# Patient Record
Sex: Male | Born: 1980 | Race: Black or African American | Hispanic: No | Marital: Single | Smoking: Current every day smoker
Health system: Southern US, Community
[De-identification: ages and names within clinical notes are randomized; demographics above are authoritative.]

## PROBLEM LIST (undated history)

## (undated) ENCOUNTER — Ambulatory Visit

## (undated) DIAGNOSIS — E785 Hyperlipidemia, unspecified: Secondary | ICD-10-CM

## (undated) DIAGNOSIS — I2510 Atherosclerotic heart disease of native coronary artery without angina pectoris: Secondary | ICD-10-CM

## (undated) HISTORY — DX: Hyperlipidemia, unspecified: E78.5

---

## 2015-08-20 ENCOUNTER — Ambulatory Visit (HOSPITAL_COMMUNITY)
Admission: EM | Admit: 2015-08-20 | Discharge: 2015-08-20 | Disposition: A | Discharge status: Home / Self Care | Payer: Self-pay | Attending: Emergency Medicine | Admitting: Emergency Medicine

## 2015-08-20 ENCOUNTER — Emergency Department (HOSPITAL_COMMUNITY): Payer: Self-pay

## 2015-08-20 ENCOUNTER — Emergency Department (HOSPITAL_COMMUNITY): Payer: Self-pay | Admitting: Certified Registered Nurse Anesthetist

## 2015-08-20 ENCOUNTER — Encounter (HOSPITAL_COMMUNITY)
Admission: EM | Disposition: A | Discharge status: Home / Self Care | Payer: Self-pay | Source: Home / Self Care | Attending: Emergency Medicine

## 2015-08-20 ENCOUNTER — Encounter (HOSPITAL_COMMUNITY): Payer: Self-pay | Admitting: Emergency Medicine

## 2015-08-20 DIAGNOSIS — S99929A Unspecified injury of unspecified foot, initial encounter: Secondary | ICD-10-CM

## 2015-08-20 DIAGNOSIS — S92532B Displaced fracture of distal phalanx of left lesser toe(s), initial encounter for open fracture: Secondary | ICD-10-CM | POA: Insufficient documentation

## 2015-08-20 DIAGNOSIS — F17290 Nicotine dependence, other tobacco product, uncomplicated: Secondary | ICD-10-CM | POA: Insufficient documentation

## 2015-08-20 DIAGNOSIS — S92912B Unspecified fracture of left toe(s), initial encounter for open fracture: Secondary | ICD-10-CM

## 2015-08-20 DIAGNOSIS — Z23 Encounter for immunization: Secondary | ICD-10-CM | POA: Insufficient documentation

## 2015-08-20 DIAGNOSIS — IMO0002 Reserved for concepts with insufficient information to code with codable children: Secondary | ICD-10-CM

## 2015-08-20 DIAGNOSIS — W28XXXA Contact with powered lawn mower, initial encounter: Secondary | ICD-10-CM | POA: Insufficient documentation

## 2015-08-20 DIAGNOSIS — W010XXA Fall on same level from slipping, tripping and stumbling without subsequent striking against object, initial encounter: Secondary | ICD-10-CM | POA: Insufficient documentation

## 2015-08-20 DIAGNOSIS — M79675 Pain in left toe(s): Secondary | ICD-10-CM

## 2015-08-20 DIAGNOSIS — Y93H2 Activity, gardening and landscaping: Secondary | ICD-10-CM | POA: Insufficient documentation

## 2015-08-20 DIAGNOSIS — Y92007 Garden or yard of unspecified non-institutional (private) residence as the place of occurrence of the external cause: Secondary | ICD-10-CM | POA: Insufficient documentation

## 2015-08-20 DIAGNOSIS — S92422B Displaced fracture of distal phalanx of left great toe, initial encounter for open fracture: Secondary | ICD-10-CM | POA: Insufficient documentation

## 2015-08-20 DIAGNOSIS — Y998 Other external cause status: Secondary | ICD-10-CM | POA: Insufficient documentation

## 2015-08-20 HISTORY — PX: I & D EXTREMITY: SHX5045

## 2015-08-20 SURGERY — IRRIGATION AND DEBRIDEMENT EXTREMITY
Anesthesia: General | Site: Foot | Laterality: Left

## 2015-08-20 MED ORDER — MIDAZOLAM HCL 5 MG/5ML IJ SOLN
INTRAMUSCULAR | Status: DC | PRN
  Administered 2015-08-20: 2 mg via INTRAVENOUS

## 2015-08-20 MED ORDER — ONDANSETRON HCL 4 MG/2ML IJ SOLN
INTRAMUSCULAR | Status: DC | PRN
  Administered 2015-08-20: 4 mg via INTRAVENOUS

## 2015-08-20 MED ORDER — CEPHALEXIN 500 MG PO CAPS: 500.0000 mg | ORAL_CAPSULE | Freq: Four times a day (QID) | ORAL | Status: DC

## 2015-08-20 MED ORDER — FENTANYL CITRATE (PF) 250 MCG/5ML IJ SOLN
INTRAMUSCULAR | Status: AC
  Filled 2015-08-20: qty 5

## 2015-08-20 MED ORDER — CEFAZOLIN SODIUM-DEXTROSE 2-4 GM/100ML-% IV SOLN
2.0000 g | INTRAVENOUS | Status: AC
  Administered 2015-08-20: 2 g via INTRAVENOUS

## 2015-08-20 MED ORDER — LIDOCAINE HCL (CARDIAC) 20 MG/ML IV SOLN
INTRAVENOUS | Status: AC
  Filled 2015-08-20: qty 5

## 2015-08-20 MED ORDER — CEFTRIAXONE SODIUM 1 G IJ SOLR
1.0000 g | Freq: Once | INTRAMUSCULAR | Status: AC
  Administered 2015-08-20: 1 g via INTRAVENOUS
  Filled 2015-08-20: qty 10

## 2015-08-20 MED ORDER — HYDROMORPHONE HCL 1 MG/ML IJ SOLN: 0.2500 mg | INTRAMUSCULAR | Status: DC | PRN

## 2015-08-20 MED ORDER — POVIDONE-IODINE 10 % EX SWAB: 2.0000 "application " | Freq: Once | CUTANEOUS | Status: DC

## 2015-08-20 MED ORDER — FENTANYL CITRATE (PF) 100 MCG/2ML IJ SOLN
INTRAMUSCULAR | Status: DC | PRN
  Administered 2015-08-20: 100 ug via INTRAVENOUS

## 2015-08-20 MED ORDER — ONDANSETRON HCL 4 MG/2ML IJ SOLN: 4.0000 mg | Freq: Four times a day (QID) | INTRAMUSCULAR | Status: DC | PRN

## 2015-08-20 MED ORDER — DEXAMETHASONE SODIUM PHOSPHATE 10 MG/ML IJ SOLN
INTRAMUSCULAR | Status: AC
  Filled 2015-08-20: qty 1

## 2015-08-20 MED ORDER — ACETAMINOPHEN 325 MG PO TABS: 650.0000 mg | ORAL_TABLET | Freq: Four times a day (QID) | ORAL | Status: DC | PRN

## 2015-08-20 MED ORDER — SODIUM CHLORIDE 0.9 % IR SOLN
Status: DC | PRN
  Administered 2015-08-20: 3000 mL

## 2015-08-20 MED ORDER — OXYCODONE HCL 5 MG PO TABS: 5.0000 mg | ORAL_TABLET | Freq: Four times a day (QID) | ORAL | Status: DC | PRN

## 2015-08-20 MED ORDER — ONDANSETRON HCL 4 MG/2ML IJ SOLN
INTRAMUSCULAR | Status: AC
Start: 2015-08-20 — End: 2015-08-20
  Filled 2015-08-20: qty 2

## 2015-08-20 MED ORDER — PHENYLEPHRINE 40 MCG/ML (10ML) SYRINGE FOR IV PUSH (FOR BLOOD PRESSURE SUPPORT)
PREFILLED_SYRINGE | INTRAVENOUS | Status: AC
  Filled 2015-08-20: qty 10

## 2015-08-20 MED ORDER — GENTAMICIN SULFATE 40 MG/ML IJ SOLN
1.5000 mg/kg | Freq: Once | INTRAVENOUS | Status: AC
  Administered 2015-08-20: 130 mg via INTRAVENOUS
  Filled 2015-08-20: qty 3.25

## 2015-08-20 MED ORDER — SUCCINYLCHOLINE CHLORIDE 20 MG/ML IJ SOLN
INTRAMUSCULAR | Status: AC
  Filled 2015-08-20: qty 1

## 2015-08-20 MED ORDER — LACTATED RINGERS IV SOLN
INTRAVENOUS | Status: DC | PRN
  Administered 2015-08-20: 14:00:00 via INTRAVENOUS

## 2015-08-20 MED ORDER — OXYCODONE HCL 5 MG PO TABS: 5.0000 mg | ORAL_TABLET | Freq: Once | ORAL | Status: DC | PRN

## 2015-08-20 MED ORDER — BUPIVACAINE-EPINEPHRINE (PF) 0.5% -1:200000 IJ SOLN
INTRAMUSCULAR | Status: AC
  Filled 2015-08-20: qty 30

## 2015-08-20 MED ORDER — LIDOCAINE HCL (PF) 1 % IJ SOLN
30.0000 mL | Freq: Once | INTRAMUSCULAR | Status: AC
  Administered 2015-08-20: 30 mL via INTRADERMAL
  Filled 2015-08-20: qty 30

## 2015-08-20 MED ORDER — LIDOCAINE HCL (CARDIAC) 20 MG/ML IV SOLN
INTRAVENOUS | Status: DC | PRN
  Administered 2015-08-20: 70 mg via INTRAVENOUS

## 2015-08-20 MED ORDER — NAPROXEN SODIUM 220 MG PO TABS: 440.0000 mg | ORAL_TABLET | Freq: Two times a day (BID) | ORAL | Status: DC

## 2015-08-20 MED ORDER — BUPIVACAINE-EPINEPHRINE (PF) 0.5% -1:200000 IJ SOLN
INTRAMUSCULAR | Status: DC | PRN
  Administered 2015-08-20: 20 mL via PERINEURAL

## 2015-08-20 MED ORDER — HYDROMORPHONE HCL 1 MG/ML IJ SOLN
1.0000 mg | Freq: Once | INTRAMUSCULAR | Status: AC
  Administered 2015-08-20: 1 mg via INTRAVENOUS
  Filled 2015-08-20: qty 1

## 2015-08-20 MED ORDER — DEXAMETHASONE SODIUM PHOSPHATE 10 MG/ML IJ SOLN
INTRAMUSCULAR | Status: DC | PRN
  Administered 2015-08-20: 10 mg via INTRAVENOUS

## 2015-08-20 MED ORDER — SODIUM CHLORIDE 0.9 % IV SOLN: INTRAVENOUS | Status: DC

## 2015-08-20 MED ORDER — PROPOFOL 10 MG/ML IV BOLUS
INTRAVENOUS | Status: DC | PRN
  Administered 2015-08-20: 200 mg via INTRAVENOUS

## 2015-08-20 MED ORDER — OXYCODONE HCL 5 MG/5ML PO SOLN: 5.0000 mg | Freq: Once | ORAL | Status: DC | PRN

## 2015-08-20 MED ORDER — TETANUS-DIPHTH-ACELL PERTUSSIS 5-2.5-18.5 LF-MCG/0.5 IM SUSP
0.5000 mL | Freq: Once | INTRAMUSCULAR | Status: AC
  Administered 2015-08-20: 0.5 mL via INTRAMUSCULAR
  Filled 2015-08-20: qty 0.5

## 2015-08-20 MED ORDER — MIDAZOLAM HCL 2 MG/2ML IJ SOLN
INTRAMUSCULAR | Status: AC
  Filled 2015-08-20: qty 2

## 2015-08-20 SURGICAL SUPPLY — 60 items
BANDAGE ACE 4X5 VEL STRL LF (GAUZE/BANDAGES/DRESSINGS) ×3 IMPLANT
BANDAGE ELASTIC 4 VELCRO ST LF (GAUZE/BANDAGES/DRESSINGS) ×3 IMPLANT
BANDAGE ESMARK 6X9 LF (GAUZE/BANDAGES/DRESSINGS) ×1 IMPLANT
BLADE SURG 10 STRL SS (BLADE) ×3 IMPLANT
BNDG COHESIVE 4X5 TAN STRL (GAUZE/BANDAGES/DRESSINGS) ×3 IMPLANT
BNDG COHESIVE 6X5 TAN STRL LF (GAUZE/BANDAGES/DRESSINGS) ×3 IMPLANT
BNDG CONFORM 3 STRL LF (GAUZE/BANDAGES/DRESSINGS) ×3 IMPLANT
BNDG ESMARK 6X9 LF (GAUZE/BANDAGES/DRESSINGS) ×3
CANISTER SUCT 3000ML PPV (MISCELLANEOUS) ×3 IMPLANT
CHLORAPREP W/TINT 26ML (MISCELLANEOUS) IMPLANT
COVER SURGICAL LIGHT HANDLE (MISCELLANEOUS) ×3 IMPLANT
CUFF TOURNIQUET SINGLE 34IN LL (TOURNIQUET CUFF) IMPLANT
CUFF TOURNIQUET SINGLE 44IN (TOURNIQUET CUFF) IMPLANT
DRAIN CHANNEL 10M FLAT 3/4 FLT (DRAIN) IMPLANT
DRAIN PENROSE 1/2X12 LTX STRL (WOUND CARE) IMPLANT
DRAPE U-SHAPE 47X51 STRL (DRAPES) ×3 IMPLANT
DRSG ADAPTIC 3X8 NADH LF (GAUZE/BANDAGES/DRESSINGS) ×3 IMPLANT
DRSG MEPITEL 3X4 ME34 (GAUZE/BANDAGES/DRESSINGS) ×3 IMPLANT
DRSG PAD ABDOMINAL 8X10 ST (GAUZE/BANDAGES/DRESSINGS) IMPLANT
ELECT REM PT RETURN 9FT ADLT (ELECTROSURGICAL) ×3
ELECTRODE REM PT RTRN 9FT ADLT (ELECTROSURGICAL) ×1 IMPLANT
EVACUATOR SILICONE 100CC (DRAIN) IMPLANT
GAUZE SPONGE 4X4 12PLY STRL (GAUZE/BANDAGES/DRESSINGS) ×3 IMPLANT
GLOVE BIO SURGEON STRL SZ8 (GLOVE) ×3 IMPLANT
GLOVE BIOGEL PI IND STRL 8 (GLOVE) ×2 IMPLANT
GLOVE BIOGEL PI INDICATOR 8 (GLOVE) ×4
GLOVE ECLIPSE 7.5 STRL STRAW (GLOVE) ×3 IMPLANT
GOWN STRL REUS W/ TWL XL LVL3 (GOWN DISPOSABLE) ×2 IMPLANT
GOWN STRL REUS W/TWL XL LVL3 (GOWN DISPOSABLE) ×4
KIT BASIN OR (CUSTOM PROCEDURE TRAY) ×3 IMPLANT
KIT ROOM TURNOVER OR (KITS) ×3 IMPLANT
NEEDLE 22X1 1/2 (OR ONLY) (NEEDLE) ×3 IMPLANT
NS IRRIG 1000ML POUR BTL (IV SOLUTION) ×3 IMPLANT
PACK ORTHO EXTREMITY (CUSTOM PROCEDURE TRAY) ×3 IMPLANT
PAD ARMBOARD 7.5X6 YLW CONV (MISCELLANEOUS) ×6 IMPLANT
PAD CAST 4YDX4 CTTN HI CHSV (CAST SUPPLIES) ×1 IMPLANT
PADDING CAST ABS 4INX4YD NS (CAST SUPPLIES) ×2
PADDING CAST ABS COTTON 4X4 ST (CAST SUPPLIES) ×1 IMPLANT
PADDING CAST COTTON 4X4 STRL (CAST SUPPLIES) ×2
SET CYSTO W/LG BORE CLAMP LF (SET/KITS/TRAYS/PACK) ×3 IMPLANT
SOAP 2 % CHG 4 OZ (WOUND CARE) ×3 IMPLANT
SPONGE LAP 4X18 X RAY DECT (DISPOSABLE) ×3 IMPLANT
STAPLER VISISTAT 35W (STAPLE) IMPLANT
SUCTION FRAZIER HANDLE 10FR (MISCELLANEOUS) ×2
SUCTION TUBE FRAZIER 10FR DISP (MISCELLANEOUS) ×1 IMPLANT
SUT ETHILON 3 0 FSL (SUTURE) IMPLANT
SUT ETHILON 4 0 PS 2 18 (SUTURE) ×3 IMPLANT
SUT MNCRL AB 3-0 PS2 18 (SUTURE) ×3 IMPLANT
SUT PROLENE 3 0 PS 2 (SUTURE) IMPLANT
SUT VIC AB 2-0 CT1 27 (SUTURE)
SUT VIC AB 2-0 CT1 TAPERPNT 27 (SUTURE) IMPLANT
SUT VIC AB 3-0 PS2 18 (SUTURE)
SUT VIC AB 3-0 PS2 18XBRD (SUTURE) IMPLANT
TOWEL OR 17X24 6PK STRL BLUE (TOWEL DISPOSABLE) ×3 IMPLANT
TOWEL OR 17X26 10 PK STRL BLUE (TOWEL DISPOSABLE) ×3 IMPLANT
TUBE CONNECTING 12'X1/4 (SUCTIONS) ×1
TUBE CONNECTING 12X1/4 (SUCTIONS) ×2 IMPLANT
TUBING CYSTO DISP (UROLOGICAL SUPPLIES) ×3 IMPLANT
WATER STERILE IRR 1000ML POUR (IV SOLUTION) IMPLANT
YANKAUER SUCT BULB TIP NO VENT (SUCTIONS) ×3 IMPLANT

## 2015-08-20 NOTE — Transfer of Care (Signed)
Immediate Anesthesia Transfer of Care Note  Patient: Edward Schroeder  Procedure(s) Performed: Procedure(s): IRRIGATION AND DEBRIDEMENT OF FIRST SECOND AND THIRD TOE FRACTURE (Left)  Patient Location: PACU  Anesthesia Type:General  Level of Consciousness: awake, alert  and oriented  Airway & Oxygen Therapy: Patient Spontanous Breathing  Post-op Assessment: Report given to RN and Post -op Vital signs reviewed and stable  Post vital signs: Reviewed and stable  Last Vitals:  Filed Vitals:   08/20/15 1200 08/20/15 1230  BP: 117/78 128/87  Pulse:  50  Temp:    Resp:      Complications: No apparent anesthesia complications

## 2015-08-20 NOTE — ED Notes (Signed)
Sterile saline poured over pt foot and placed in a bath.

## 2015-08-20 NOTE — ED Notes (Signed)
MD at bedside. 

## 2015-08-20 NOTE — Anesthesia Preprocedure Evaluation (Addendum)
Anesthesia Evaluation  Patient identified by MRN, date of birth, ID band Patient awake    Reviewed: Allergy & Precautions, NPO status , Patient's Chart, lab work & pertinent test results  Airway Mallampati: II   Neck ROM: full    Dental   Pulmonary Current Smoker,    breath sounds clear to auscultation       Cardiovascular Exercise Tolerance: Good negative cardio ROS   Rhythm:regular Rate:Normal     Neuro/Psych negative neurological ROS  negative psych ROS   GI/Hepatic negative GI ROS, Neg liver ROS,   Endo/Other  negative endocrine ROS  Renal/GU negative Renal ROS     Musculoskeletal   Abdominal   Peds  Hematology negative hematology ROS (+)   Anesthesia Other Findings   Reproductive/Obstetrics                            BP Readings from Last 3 Encounters:  08/20/15 128/87   No results found for: WBC, HGB, HCT, MCV, PLT   Chemistry   No results found for: NA, K, CL, CO2, BUN, CREATININE, GLU No results found for: CALCIUM, ALKPHOS, AST, ALT, BILITOT    Anesthesia Physical Anesthesia Plan  ASA: II  Anesthesia Plan: General   Post-op Pain Management:    Induction: Intravenous  Airway Management Planned: LMA  Additional Equipment:   Intra-op Plan:   Post-operative Plan:   Informed Consent: I have reviewed the patients History and Physical, chart, labs and discussed the procedure including the risks, benefits and alternatives for the proposed anesthesia with the patient or authorized representative who has indicated his/her understanding and acceptance.     Plan Discussed with: CRNA, Surgeon and Anesthesiologist  Anesthesia Plan Comments:         Anesthesia Quick Evaluation

## 2015-08-20 NOTE — ED Notes (Signed)
Pt arrives via pov with c/o getting his left foot stuck under a lawnmower, bleeding controlled at this time, all toes appear to be intact.

## 2015-08-20 NOTE — H&P (Signed)
Edward Schroeder is an 35 y.o. male.   Chief Complaint: left forefoot injury HPI:  35 y/o male without significant PMH presents to the ER this morning after injuring his foot while mowing the grass.  He was pushing a lawnmower down a hill when he slipped on wet grass and his left foot went under the mower deck.  He had shoes on.  No h/o significant injury or surgery to the left foot or ankle.  He smokes black and milds.  No recent f/c/n/v/wt loss.  He c/o aching pain in the toes that is worse with ROM and better with pain meds.  He denies numbness in the toes.  He last ate at 0300 this morning.  He's had a dose of rocephin this morning in the ER.  History reviewed. No pertinent past medical history.  No PMH.  History reviewed. No pertinent past surgical history.  No PSH  No family history on file.  FH neg for diabetes and HTN.  Social History:  reports that he has been smoking Cigars.  He does not have any smokeless tobacco history on file. He reports that he does not drink alcohol or use illicit drugs.  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  Allergies: No Known Allergies   (Not in a hospital admission)  No results found for this or any previous visit (from the past 48 hour(s)). Dg Foot Complete Left  08/20/2015  CLINICAL DATA:  Acute left foot pain after lawnmower injury. Initial encounter. EXAM: LEFT FOOT - COMPLETE 3+ VIEW COMPARISON:  None. FINDINGS: Moderately displaced fractures are seen involving the second and third distal phalanges. Minimally displaced fracture is seen involving the proximal base of the first distal phalanx. These appear to be closed and posttraumatic. Joint spaces are intact. No radiopaque foreign body is noted. IMPRESSION: Fractures are seen involving the first, second and distal phalanges. Electronically Signed   By: Lupita RaiderJames  Schroeder Jr, M.D.   On: 08/20/2015 09:46    ROS  No recent f/c/n/v/wt loss.  Blood pressure 122/90, pulse 70, temperature 98.2  F (36.8 C), temperature source Oral, resp. rate 12, SpO2 96 %. Physical Exam  wn wd male in nad. A and O x 4.  Mood and affect normal.  EOMI.  resp unlabored.  L foot with lacerations at the tips of the hallux, 2nd and 3rd toes.  Nail bed involvement of hallux and 2nd toe.  Cap refill intact at the tips of the toes, but the flap at the tip of the 2nd toe is slightly dusky.  Gross contamination of the wounds with dirt and grass.  Sens to LT intact at the toes.  No lymphadenopathy.  5/5 strength in PF and DF of the ankle.  Assessment/Plan L hallux, 2nd and 3rd toe open fractures and laceration - to OR for I and D of open fracture.  Gent in addition to cephalosporin given the level of contamination.  The risks and benefits of the alternative treatment options have been discussed in detail.  The patient wishes to proceed with surgery and specifically understands risks of bleeding, infection, nerve damage, blood clots, need for additional surgery, amputation and death.  I've specifically expressed my concern regarding the tip of the 2nd toe.  I explained that it may not survive and may ultimately require amputation.  Edward Schroeder, Edward Nienhuis, MD 08/20/2015, 10:48 AM

## 2015-08-20 NOTE — Anesthesia Procedure Notes (Signed)
Procedure Name: LMA Insertion Date/Time: 08/20/2015 2:01 PM Performed by: Fabian NovemberSOLHEIM, Majesty Stehlin SALOMAN Pre-anesthesia Checklist: Patient identified, Patient being monitored, Timeout performed, Emergency Drugs available and Suction available Patient Re-evaluated:Patient Re-evaluated prior to inductionOxygen Delivery Method: Circle System Utilized Preoxygenation: Pre-oxygenation with 100% oxygen Intubation Type: IV induction Ventilation: Mask ventilation without difficulty LMA: LMA inserted LMA Size: 5.0 Number of attempts: 1 Placement Confirmation: positive ETCO2 and breath sounds checked- equal and bilateral Tube secured with: Tape Dental Injury: Teeth and Oropharynx as per pre-operative assessment

## 2015-08-20 NOTE — Discharge Instructions (Signed)
Toni ArthursJohn Yashira Offenberger, MD [facility]  Please read the following information regarding your care after surgery.  Medications  You only need a prescription for the narcotic pain medicine (ex. oxycodone, Percocet, Norco).  All of the other medicines listed below are available over the counter. X acetominophen (Tylenol) 650 mg every 4-6 hours as you need for minor pain X oxycodone as prescribed for severe pain X Aleve 2 pills twice a day for 3-5 days after surgery  Weight Bearing X Bear weight when you are able on your operated leg or foot in the post-op shoe.  Cast / Splint / Dressing X Keep your dressing clean and dry.  Dont put anything (coat hanger, pencil, etc) down inside of it.  If it gets damp, use a hair dryer on the cool setting to dry it.  If it gets soaked, call the office to schedule an appointment for a cast change.   After your dressing, cast or splint is removed; you may shower, but do not soak or scrub the wound.  Allow the water to run over it, and then gently pat it dry.  Swelling It is normal for you to have swelling where you had surgery.  To reduce swelling and pain, keep your toes above your nose for at least 3 days after surgery.  It may be necessary to keep your foot or leg elevated for several weeks.  If it hurts, it should be elevated.  Follow Up Call my office at [phone] when you are discharged from the hospital or surgery center to schedule an appointment to be seen two weeks after surgery.  Call my office at [phone] if you develop a fever >101.5 F, nausea, vomiting, bleeding from the surgical site or severe pain.

## 2015-08-20 NOTE — ED Provider Notes (Signed)
CSN: [id number]     Arrival date & time 08/20/15  0831 History   First MD Initiated Contact with Patient 08/20/15 [phone]     Chief Complaint  Patient presents with  . Toe Injury     (Consider location/radiation/quality/duration/timing/severity/associated sxs/prior Treatment) HPI Edward Schroeder is a 35 y.o. male with no significant PMH who presents with toe injury sustained just PTA.  Patient reports he was mowing the lawn in tennis shoes when he slipped on a hill causing his left toes to slide underneath the mower and got caught in the blades. NKA.  No meds PTA.  His symptoms were sudden in onset, constant, and severe. Tetanus status unknown. He last ate yesterday.   History reviewed. No pertinent past medical history. History reviewed. No pertinent past surgical history. No family history on file. Social History  Substance Use Topics  . Smoking status: Current Every Day Smoker    Types: Cigars  . Smokeless tobacco: None  . Alcohol Use: No    Review of Systems All other systems negative unless otherwise stated in HPI    Allergies  Review of patient's allergies indicates no known allergies.  Home Medications   Prior to Admission medications   Not on File   BP 131/86 mmHg  Pulse 57  Temp(Src) 98.2 F (36.8 C) (Oral)  Resp 12  SpO2 97% Physical Exam  Constitutional: He is oriented to person, place, and time. He appears well-developed and well-nourished.  HENT:  Head: Normocephalic and atraumatic.  Right Ear: External ear normal.  Left Ear: External ear normal.  Eyes: Conjunctivae are normal. No scleral icterus.  Neck: No tracheal deviation present.  Cardiovascular:  Pulses:      Dorsalis pedis pulses are 2+ on the right side, and 2+ on the left side.  Pulmonary/Chest: Effort normal. No respiratory distress.  Abdominal: He exhibits no distension.  Musculoskeletal: Normal range of motion.  Neurological: He is alert and oriented to person, place, and time.  Sensation  intact distal to injury.  Patient able to move all toes.   Skin: Skin is warm and dry. Laceration noted.  Left great toe: nail has been cut, suspect nail bed laceration.  Grass and dirt in lacerations.  See photos below.   Psychiatric: He has a normal mood and affect. His behavior is normal.      ED Course  Procedures (including critical care time)  NERVE BLOCK Performed by: Cheri FowlerKayla Bradie Lacock Consent: Verbal consent obtained. Required items: required blood products, implants, devices, and special equipment available Time out: Immediately prior to procedure a "time out" was called to verify the correct patient, procedure, equipment, support staff and site/side marked as required.  Indication: toe injuries on left 1st, 2nd, and 3rd toes Nerve block body site: left 1st, 2nd, and 3rd toes  Preparation: Patient was prepped and draped in the usual sterile fashion. Needle gauge: 24 G Location technique: anatomical landmarks  Local anesthetic: lidocaine 1% without epinephrine  Anesthetic total: 8 ml  Outcome: pain improved Patient tolerance: Patient tolerated the procedure well with no immediate complications.  Labs Review Labs Reviewed - No data to display  Imaging Review Dg Foot Complete Left  08/20/2015  CLINICAL DATA:  Acute left foot pain after lawnmower injury. Initial encounter. EXAM: LEFT FOOT - COMPLETE 3+ VIEW COMPARISON:  None. FINDINGS: Moderately displaced fractures are seen involving the second and third distal phalanges. Minimally displaced fracture is seen involving the proximal base of the first distal phalanx. These appear to be closed  and posttraumatic. Joint spaces are intact. No radiopaque foreign body is noted. IMPRESSION: Fractures are seen involving the first, second and distal phalanges. Electronically Signed   By: Lupita Raider, M.D.   On: 08/20/2015 09:46   I have personally reviewed and evaluated these images and lab results as part of my medical  decision-making.   EKG Interpretation None      MDM   Final diagnoses:  Toe fracture, left, open, initial encounter  Laceration  Pain of toe of left foot   Patient presents with multiple toe injuries sustained from lawn mower blade.  VSS, NAD.  NKA.  Will perform digital blocks, manage pain, start 1g IV rocephin, up date tetanus, obtain plain films.  Plain films show fractures involving the first, second, and third distal phalanges.  Will consult ortho.  11 AM: patient will be taken to OR for I&D by orthopedics, Dr. Victorino Dike, for I&D. 1.5 mg/kg IV Gentamicin ordered. Wound gently irrigated with 1L NS and loosely dressed.   Case has been discussed with and seen by Dr. Radford Pax who agrees with the above plan.     Cheri Fowler, PA-C 08/20/15 1137  Nelva Nay, MD 08/20/15 1154

## 2015-08-20 NOTE — Op Note (Signed)
Edward Schroeder, Edward Schroeder NO.:  [id number]  MEDICAL RECORD NO.:  [id number]  LOCATION:  [facility]                         FACILITY:  [facility]  PHYSICIAN:  Toni Arthurs, MD        DATE OF BIRTH:  April 20, 1981  DATE OF PROCEDURE:  08/20/2015 DATE OF DISCHARGE:                              OPERATIVE REPORT   PREOPERATIVE DIAGNOSES: 1. Open fracture left hallux with gross contamination. 2. Open fracture left second toe with gross contamination. 3. Open fracture left third toe with gross contamination. 4. Left hallux stellate nail bed laceration. 5. Left second toe transverse laceration. 6. Left third toe nail bed laceration.  POSTOPERATIVE DIAGNOSES: 1. Open fracture left hallux distal phalanx with gross contamination. 2. Open fracture left second toe distal phalanx with gross     contamination. 3. Left hallux stellate nail bed laceration measuring 2 cm in length. 4. Left second toe transverse laceration measuring 1.5 cm in length. 5. Left third toe nail bed laceration measuring 1 cm with gross     contamination.  PROCEDURE: 1. Irrigation and debridement of left hallux open fracture including     skin, subcutaneous tissue, and bone. 2. Irrigation and debridement of left second toe open fracture     including skin, subcutaneous tissue, and bone. 3. Complex closure of left hallux nail bed laceration 2 cm in length. 4. Complex closure left second toe laceration 1.5 cm in length. 5. Complex closure left third toe nail bed laceration 1 cm in length. 6. Closed treatment of left second toe fracture with manipulation.  SURGEON:  Toni Arthurs, M.D.  ANESTHESIA:  General.  ESTIMATED BLOOD LOSS:  Minimal.  TOURNIQUET TIME:  Approximately 30 minutes with an ankle Esmarch.  COMPLICATIONS:  None apparent.  DISPOSITION:  Extubated, awake, and stable to recovery.  INDICATION FOR PROCEDURE:  The patient is a 35 year old male with past medical history significant only for smoking.   He was mowing grass early this morning when he slipped walking down a hill behind his lawn mower. He slid forward and his left foot went underneath the mower deck.  He was evaluated in the emergency department and the physical examination revealed open fractures of the hallux second and third toes on his left foot with gross contamination with dirt and grass.  He had a dose of Rocephin in the emergency department.  He also underwent provisional irrigation and dressing of the wound.  He presents now for operative treatment of these open fractures.  He understands the risks and benefits, the alternative treatment options, and elects surgical treatment.  He specifically understands risks of bleeding, infection, nerve damage, blood clots, need for additional surgery, continued pain, nonunion, amputation, and death.  PROCEDURE IN DETAIL:  After preoperative consent was obtained and the correct operative site was identified, the patient was brought to the operating room and placed supine on the operating table.  General anesthesia was induced.  Preoperative antibiotics were administered. This consisted of cefazolin and gentamicin given the gross contamination of the wound.  Surgical time-out was taken.  Left lower extremity was then prepped and draped in standard sterile fashion.  Foot was exsanguinated and an Esmarch tourniquet wrapped  around the ankle.  The left forefoot wounds were then carefully examined.  The hallux nail had been previously removed.  The hallux was noted to have a stellate laceration that was involving the entire nail bed.  The tuft of the distal phalanx was noted to be fractured.  The nail bed laceration totaled 2 cm in length.  The second toe was then carefully examined, and there was a transverse laceration between the dorsal aspect of the DIP joint and the proximal nail fold.  This fracture was noted to extend down through the distal phalanx bone.  The laceration  measured 1.5 cm in length and involved nearly 50% of the medial aspect of the toe.  The third toe was then carefully examined.  It was noted to have a transverse laceration across the nail bed with no apparent fracture though the bone was exposed at the base of the nail bed.  The nail was loose from the nail bed and the germinal matrix.  Careful debridement was then performed with scissors and a rongeur.  All devitalized tissue and gross contamination was removed from all 3 wounds.  At the hallux fracture site, the distal fragment was noted to be quite small and this was removed with a rongeur.  The tuft of the hallux was also shortened slightly with the rongeur to allow better soft tissue coverage.  The second toe fracture site was also cleaned with a rongeur.  There was no evidence of comminution, so the distal bone was left in place but carefully debrided.  The third toe nail bed laceration was debrided after the nail was removed in its entirety.  Debridement in all 3 cases was carried out in circumferential fashion from the level of the skin down through subcutaneous tissues to the level of the bone. The 3 wounds were then carefully irrigated with 3 L of normal saline. The hallux nail bed laceration was then repaired with simple sutures of 4-0 Monocryl.  Once all of the bone was covered by the nail bed, the distal laceration was closed with horizontal mattress sutures of 3-0 nylon.  The second toe laceration was then repaired with simple and horizontal mattress sutures of 3-0 nylon.  The third toe nail bed laceration was then repaired with simple sutures of 4-0 Monocryl.  A 0.5% Marcaine with epinephrine was then infiltrated into the first, second and third web spaces, and digital block performed at the hallux for postoperative pain control.  Sterile dressings were applied followed by a well-padded compression wrap.  Tourniquet was released prior to dressing the wounds approximately  30 minutes.  The hallux fracture was effectively treated by removing the loose fragment of bone distally.  The second toe fracture was appropriately reduced and was generally stable with the soft tissue closer.  I believe this can be treated safely in closed fashion after reducing the fracture site.  The patient was then awakened from anesthesia and transported to the recovery room in stable condition.  FOLLOWUP PLAN:  The patient will be weightbearing as tolerated on his left foot in a flat postop shoe.  He will follow up with me in the office on Wednesday for wound check and dressing change.  He will be discharged on Keflex 500 mg 3 times a day for a week as well as oxycodone and Aleve for pain.     Toni ArthursJohn Valoria Tamburri, MD     JH/MEDQ  D:  08/20/2015  T:  08/20/2015  Job:  [id number]

## 2015-08-20 NOTE — Anesthesia Postprocedure Evaluation (Signed)
Anesthesia Post Note  Patient: Edward Schroeder  Procedure(s) Performed: Procedure(s) (LRB): IRRIGATION AND DEBRIDEMENT OF FIRST SECOND AND THIRD TOE FRACTURE (Left)  Patient location during evaluation: PACU Anesthesia Type: General Level of consciousness: awake and alert and patient cooperative Pain management: pain level controlled Vital Signs Assessment: post-procedure vital signs reviewed and stable Respiratory status: spontaneous breathing and respiratory function stable Cardiovascular status: stable Anesthetic complications: no    Last Vitals:  Filed Vitals:   08/20/15 1500 08/20/15 1530  BP: 110/60 108/63  Pulse: 54 46  Temp:    Resp: 11 11    Last Pain:  Filed Vitals:   08/20/15 1538  PainSc: Asleep                 Akiko Schexnider S

## 2015-08-20 NOTE — Brief Op Note (Signed)
08/20/2015  2:46 PM  PATIENT:  Edward Schroeder  35 y.o. male  PRE-OPERATIVE DIAGNOSIS: 1.  Open fracture left hallux with gross contamination      2.  Open fracture left 2nd toe with gross contamination      3.  Open fracture left 3rd toe with gross contamination      4.  Left hallux stellate laceration      5.  Left 2nd toe transverse laceration      6.  Left 3rd toe nailbed laceration  POST-OPERATIVE DIAGNOSIS: 1.  Open fracture left hallux distal phalanx with gross contamination      2.  Open fracture left 2nd toe distal phalanx with gross contamination      3.  Left hallux stellate nailbed laceration (2 cm)      4.  Left 2nd toe transverse laceration (1.5 cm)      5.  Left 3rd toe nailbed laceration (1 cm) with gross contamination  Procedure(s): 1.  Irrigation and debridement of left hallux open fracture including skin, subcut tissue and bone   2.  Irrigation and debridement of left 2nd toe open fracture including skin, subcut tissue and bone   3.  Complex closure left hallux nail bed laceration (2 cm)   4.  Complex closure left 2nd toe laceration (1.5 cm)   5.  Complex closure left 3rd toe nailbed laceration (1 cm)   6.  Closed treatment left 2nd toe fracture with manipulation   SURGEON:  Toni ArthursJohn Whittaker Lenis, MD  ASSISTANT: n/a  ANESTHESIA:   General  EBL:  minimal   TOURNIQUET:  approx 30 min with ankle esmarch  COMPLICATIONS:  None apparent  DISPOSITION:  Extubated, awake and stable to recovery.  DICTATION ID:  [id number]

## 2015-08-20 NOTE — Progress Notes (Signed)
Orthopedic Tech Progress Note Patient Details:  Edward FannyCalvin Schroeder 10-Oct-1980 [id number]  Ortho Devices Type of Ortho Device: Postop shoe/boot Ortho Device/Splint Location: lle Ortho Device/Splint Interventions: Application As ordered by Dr. Ann MakiHewitt  Edward Schroeder 08/20/2015, 3:33 PM

## 2015-08-22 ENCOUNTER — Encounter (HOSPITAL_COMMUNITY): Payer: Self-pay | Admitting: Orthopedic Surgery

## 2016-06-03 ENCOUNTER — Encounter (HOSPITAL_COMMUNITY): Payer: Self-pay | Admitting: *Deleted

## 2016-06-03 ENCOUNTER — Emergency Department (HOSPITAL_COMMUNITY): Payer: Self-pay

## 2016-06-03 ENCOUNTER — Observation Stay (HOSPITAL_COMMUNITY)
Admission: EM | Admit: 2016-06-03 | Discharge: 2016-06-04 | Disposition: A | Discharge status: Home / Self Care | Payer: Self-pay | Attending: Student in an Organized Health Care Education/Training Program | Admitting: Student in an Organized Health Care Education/Training Program

## 2016-06-03 DIAGNOSIS — F17290 Nicotine dependence, other tobacco product, uncomplicated: Secondary | ICD-10-CM

## 2016-06-03 DIAGNOSIS — J111 Influenza due to unidentified influenza virus with other respiratory manifestations: Principal | ICD-10-CM

## 2016-06-03 DIAGNOSIS — R51 Headache: Secondary | ICD-10-CM

## 2016-06-03 DIAGNOSIS — M549 Dorsalgia, unspecified: Secondary | ICD-10-CM

## 2016-06-03 DIAGNOSIS — R519 Headache, unspecified: Secondary | ICD-10-CM | POA: Diagnosis present

## 2016-06-03 DIAGNOSIS — R509 Fever, unspecified: Secondary | ICD-10-CM | POA: Diagnosis present

## 2016-06-03 DIAGNOSIS — M545 Low back pain: Secondary | ICD-10-CM

## 2016-06-03 DIAGNOSIS — M5126 Other intervertebral disc displacement, lumbar region: Secondary | ICD-10-CM

## 2016-06-03 LAB — CSF CELL COUNT WITH DIFFERENTIAL
RBC COUNT CSF: 1095 /mm3 — AB
RBC COUNT CSF: 9825 /mm3 — AB
TUBE #: 1
TUBE #: 4
WBC, CSF: 2 /mm3 (ref 0–5)
WBC, CSF: 5 /mm3 (ref 0–5)

## 2016-06-03 LAB — COMPREHENSIVE METABOLIC PANEL
ALBUMIN: 4.2 g/dL (ref 3.5–5.0)
ALT: 20 U/L (ref 17–63)
ANION GAP: 9 (ref 5–15)
AST: 26 U/L (ref 15–41)
Alkaline Phosphatase: 55 U/L (ref 38–126)
BUN: 11 mg/dL (ref 6–20)
CHLORIDE: 103 mmol/L (ref 101–111)
CO2: 23 mmol/L (ref 22–32)
CREATININE: 1.15 mg/dL (ref 0.61–1.24)
Calcium: 9.4 mg/dL (ref 8.9–10.3)
GFR calc non Af Amer: >60 mL/min (ref >60)
Glucose, Bld: 124 mg/dL — ABNORMAL HIGH (ref 65–99)
Potassium: 4.1 mmol/L (ref 3.5–5.1)
SODIUM: 135 mmol/L (ref 135–145)
Total Bilirubin: 0.3 mg/dL (ref 0.3–1.2)
Total Protein: 7.1 g/dL (ref 6.5–8.1)

## 2016-06-03 LAB — URINALYSIS, ROUTINE W REFLEX MICROSCOPIC
BILIRUBIN URINE: NEGATIVE
Glucose, UA: NEGATIVE mg/dL
HGB URINE DIPSTICK: NEGATIVE
Ketones, ur: NEGATIVE mg/dL
Leukocytes, UA: NEGATIVE
Nitrite: NEGATIVE
PROTEIN: NEGATIVE mg/dL
Specific Gravity, Urine: 1.032 — ABNORMAL HIGH (ref 1.005–1.030)
pH: 7 (ref 5.0–8.0)

## 2016-06-03 LAB — CBC WITH DIFFERENTIAL/PLATELET
Basophils Absolute: 0 10*3/uL (ref 0.0–0.1)
Basophils Relative: 0 %
Eosinophils Absolute: 0 10*3/uL (ref 0.0–0.7)
Eosinophils Relative: 0 %
HEMATOCRIT: 46.6 % (ref 39.0–52.0)
HEMOGLOBIN: 15.9 g/dL (ref 13.0–17.0)
LYMPHS ABS: 1.2 10*3/uL (ref 0.7–4.0)
Lymphocytes Relative: 12 %
MCH: 31.1 pg (ref 26.0–34.0)
MCHC: 34.1 g/dL (ref 30.0–36.0)
MCV: 91.2 fL (ref 78.0–100.0)
MONOS PCT: 1 %
Monocytes Absolute: 0.1 10*3/uL (ref 0.1–1.0)
NEUTROS ABS: 8.6 10*3/uL — AB (ref 1.7–7.7)
NEUTROS PCT: 87 %
Platelets: 216 10*3/uL (ref 150–400)
RBC: 5.11 MIL/uL (ref 4.22–5.81)
RDW: 13.5 % (ref 11.5–15.5)
WBC: 9.9 10*3/uL (ref 4.0–10.5)

## 2016-06-03 LAB — LACTIC ACID, PLASMA: Lactic Acid, Venous: 0.7 mmol/L (ref 0.5–1.9)

## 2016-06-03 LAB — RAPID URINE DRUG SCREEN, HOSP PERFORMED
Amphetamines: NOT DETECTED
BARBITURATES: NOT DETECTED
BENZODIAZEPINES: NOT DETECTED
Cocaine: NOT DETECTED
Opiates: POSITIVE — AB
TETRAHYDROCANNABINOL: NOT DETECTED

## 2016-06-03 LAB — SEDIMENTATION RATE: SED RATE: 1 mm/h (ref 0–16)

## 2016-06-03 LAB — GLUCOSE, CSF: GLUCOSE CSF: 70 mg/dL (ref 40–70)

## 2016-06-03 LAB — I-STAT CG4 LACTIC ACID, ED: Lactic Acid, Venous: 2.12 mmol/L (ref 0.5–1.9)

## 2016-06-03 LAB — PROTEIN, CSF: Total  Protein, CSF: 44 mg/dL (ref 15–45)

## 2016-06-03 LAB — C-REACTIVE PROTEIN: CRP: 0.8 mg/dL (ref <1.0)

## 2016-06-03 MED ORDER — SODIUM CHLORIDE 0.9 % IV BOLUS (SEPSIS)
1000.0000 mL | Freq: Once | INTRAVENOUS | Status: AC
  Administered 2016-06-03: 1000 mL via INTRAVENOUS

## 2016-06-03 MED ORDER — HYDROMORPHONE HCL 2 MG/ML IJ SOLN
1.0000 mg | Freq: Once | INTRAMUSCULAR | Status: AC
  Administered 2016-06-03: 1 mg via INTRAVENOUS
  Filled 2016-06-03: qty 1

## 2016-06-03 MED ORDER — DEXTROSE 5 % IV SOLN
2.0000 g | Freq: Two times a day (BID) | INTRAVENOUS | Status: DC
  Filled 2016-06-03: qty 2

## 2016-06-03 MED ORDER — CYCLOBENZAPRINE HCL 10 MG PO TABS
10.0000 mg | ORAL_TABLET | Freq: Three times a day (TID) | ORAL | Status: DC | PRN
  Administered 2016-06-04: 10 mg via ORAL
  Filled 2016-06-03: qty 1

## 2016-06-03 MED ORDER — ENOXAPARIN SODIUM 40 MG/0.4ML ~~LOC~~ SOLN
40.0000 mg | SUBCUTANEOUS | Status: DC
  Administered 2016-06-03: 40 mg via SUBCUTANEOUS
  Filled 2016-06-03: qty 0.4

## 2016-06-03 MED ORDER — ACETAMINOPHEN 650 MG RE SUPP: 650.0000 mg | Freq: Four times a day (QID) | RECTAL | Status: DC | PRN

## 2016-06-03 MED ORDER — DEXTROSE 5 % IV SOLN
2.0000 g | Freq: Once | INTRAVENOUS | Status: AC
  Administered 2016-06-03: 2 g via INTRAVENOUS
  Filled 2016-06-03: qty 2

## 2016-06-03 MED ORDER — KETOROLAC TROMETHAMINE 30 MG/ML IJ SOLN
30.0000 mg | Freq: Four times a day (QID) | INTRAMUSCULAR | Status: DC | PRN
Start: 2016-06-03 — End: 2016-06-04
  Administered 2016-06-03 – 2016-06-04 (×2): 30 mg via INTRAVENOUS
  Filled 2016-06-03 (×2): qty 1

## 2016-06-03 MED ORDER — ACETAMINOPHEN 500 MG PO TABS
1000.0000 mg | ORAL_TABLET | Freq: Once | ORAL | Status: AC
  Administered 2016-06-03: 1000 mg via ORAL
  Filled 2016-06-03: qty 2

## 2016-06-03 MED ORDER — DEXTROSE 5 % IV SOLN
2.0000 g | Freq: Two times a day (BID) | INTRAVENOUS | Status: DC
  Administered 2016-06-03: 2 g via INTRAVENOUS
  Filled 2016-06-03 (×2): qty 2

## 2016-06-03 MED ORDER — LIDOCAINE-EPINEPHRINE 1 %-1:100000 IJ SOLN: 20.0000 mL | Freq: Once | INTRAMUSCULAR | Status: DC

## 2016-06-03 MED ORDER — LIDOCAINE-EPINEPHRINE 2 %-1:100000 IJ SOLN
20.0000 mL | Freq: Once | INTRAMUSCULAR | Status: DC
  Filled 2016-06-03: qty 20

## 2016-06-03 MED ORDER — VANCOMYCIN HCL IN DEXTROSE 1-5 GM/200ML-% IV SOLN
1000.0000 mg | Freq: Three times a day (TID) | INTRAVENOUS | Status: DC
  Administered 2016-06-04: 1000 mg via INTRAVENOUS
  Filled 2016-06-03 (×2): qty 200

## 2016-06-03 MED ORDER — VANCOMYCIN HCL 10 G IV SOLR
1500.0000 mg | Freq: Once | INTRAVENOUS | Status: AC
  Administered 2016-06-03: 1500 mg via INTRAVENOUS
  Filled 2016-06-03: qty 1500

## 2016-06-03 MED ORDER — VANCOMYCIN HCL IN DEXTROSE 1-5 GM/200ML-% IV SOLN
1000.0000 mg | Freq: Three times a day (TID) | INTRAVENOUS | Status: DC
  Administered 2016-06-03: 1000 mg via INTRAVENOUS
  Filled 2016-06-03 (×2): qty 200

## 2016-06-03 MED ORDER — ACETAMINOPHEN 325 MG PO TABS
650.0000 mg | ORAL_TABLET | Freq: Four times a day (QID) | ORAL | Status: DC | PRN
  Administered 2016-06-04: 650 mg via ORAL
  Filled 2016-06-03: qty 2

## 2016-06-03 MED ORDER — GADOBENATE DIMEGLUMINE 529 MG/ML IV SOLN
18.0000 mL | Freq: Once | INTRAVENOUS | Status: AC | PRN
  Administered 2016-06-03: 18 mL via INTRAVENOUS

## 2016-06-03 NOTE — ED Notes (Signed)
Bladder scan showed in bladder

## 2016-06-03 NOTE — Progress Notes (Signed)
Edward FannyCalvin Schroeder [id number] Admission Data: 06/03/2016 6:41 PM Attending Provider: Tyson Aliasuncan Thomas Vincent, MD  PCP:No PCP Per Patient Consults/ Treatment Team:   Edward Schroeder is a 36 y.o. male patient admitted from ED awake, alert  & orientated  X 3,  No Order, VSS - Blood pressure 106/60, pulse (!) 56, temperature 99.3 F (37.4 C), temperature source Oral, resp. rate 17, height 5\' 9"  (1.753 m), weight 87.5 kg (193 lb), SpO2 100 %.no c/o shortness of breath, no c/o chest pain, no distress noted. Tele # 02 placed and pt is currently running:normal sinus rhythm.   IV site WDL:  hand right, condition patent and no redness with a transparent dsg that's clean dry and intact.  Allergies:  No Known Allergies   History reviewed. No pertinent past medical history.  History:  obtained from patient. Tobacco/alcohol: Smoked 0 packs per day for ?? Smokes black and milds years ?? social drinker  Pt orientation to unit, room and routine. Information packet given to patient/family and safety video watched.  Admission INP armband ID verified with patient/family, and in place. SR up x 2, fall risk assessment complete with Patient and family verbalizing understanding of risks associated with falls. Pt verbalizes an understanding of how to use the call bell and to call for help before getting out of bed.  Skin, clean-dry- intact without evidence of bruising, or skin tears.   No evidence of skin break down noted on exam. color normal, vascularity normal, no rashes or suspicious lesions, no evidence of bleeding or bruising, no lesions noted, no rash, no edema, temperature normal, texture normal, mobility and turgor normal    Will cont to monitor and assist as needed.  Camillo FlamingVicki L Rosevelt Luu, RN 06/03/2016 6:41 PM

## 2016-06-03 NOTE — Progress Notes (Signed)
Pharmacy Antibiotic Note  Edward Schroeder is a 36 y.o. male admitted on 06/03/2016 with severe headache and back pain.  Pharmacy has been consulted for vancomycin dosing. No meningismus.  To get MRI T/L spine to r/o discitiis/abscess, if this is neg will get LP.  Meningitis less likely but starting broad abx until ruled out.  Wt 87.5 kg, temp 101.3. WBC 9.9, ANC 9.6, lactate 2.12, creat 1.15  Plan: vancomycin 1500 mg IV loading dose Vancomycin 1000 mg IV every 8 hours.  Goal trough 15-20 mcg/mL.  Rocephin 2 gm IV q12 until meningitis ruled out F/u renal fxn, wbc, temp, culture data vanc levels as needed  Height: 5\' 9"  (175.3 cm) Weight: 193 lb (87.5 kg) IBW/kg (Calculated) : 70.7  Temp (24hrs), Avg:101.3 F (38.5 C), Min:101.3 F (38.5 C), Max:101.3 F (38.5 C)  No results for input(s): WBC, CREATININE, LATICACIDVEN, VANCOTROUGH, VANCOPEAK, VANCORANDOM, GENTTROUGH, GENTPEAK, GENTRANDOM, TOBRATROUGH, TOBRAPEAK, TOBRARND, AMIKACINPEAK, AMIKACINTROU, AMIKACIN in the last 168 hours.  CrCl cannot be calculated (No order found.).    No Known Allergies  Antimicrobials this admission: vanc 1/14>> CTX 1/14>>  Dose adjustments this admission:   Microbiology results: 1/14 BCx2>> 1/14 UA >>  Thank you for allowing pharmacy to be a part of this patient's care.  Herby AbrahamMichelle T. Dawana Asper, Pharm.D. [phone] 06/03/2016 9:51 AM

## 2016-06-03 NOTE — H&P (Signed)
Date: 06/03/2016               Patient Name:  Edward Schroeder MRN: [id number]  DOB: [birth year] Age / Sex: 36 y.o., male   PCP: No Pcp Per Patient         Medical Service: Internal Medicine Teaching Service         Attending Physician: Dr. Axel Filler, MD    First Contact: Dr. Inda Castle Pager: [phone]  Second Contact: Dr. Juleen China Pager: [phone]       After Hours (After 5p/  First Contact Pager: [phone]  weekends / holidays): Second Contact Pager: [phone]   Chief Complaint: back pain and headache  History of Present Illness: Edward Schroeder is a 36 y.o. male without significant medical history who presents with acute headache and back pain. Patient states that he woke up this AM with headache and lower back pain. He has no prior history of back pain or headaches and reports to prior or recent trauma. Patient endorses throbbing headache throughout with photophobia. Back pain is mid- to lower back, with no change in pain in relation to position, not associated with numbness, tingling, saddle parasthesia, focal weakness, bowel or bladder incontinence. He denies recent increase in activity or lifting heavy objects.  Otherwise, he denies change in vision, hearing, cough, shortness of breath, chest pain, nausea, vomiting, abdominal pain, diarrhea, constipation, bleeding, rashes, urinary symptoms.  He was in jail >1year ago and no other history recent of living in close quarters, lives with girlfriend, no sick contacts, denies taking any medicines even OTC, denies drug use past or present.   He was febrile to 101.3 in the ED, had normal CT head and MRI spine.  LP was performed and CSF analysis was unremarkable.  Meds:  No outpatient prescriptions have been marked as taking for the 06/03/16 encounter [name] Encounter).  None   Allergies: Allergies as of 06/03/2016  . (No Known Allergies)   History reviewed. No pertinent past medical history.  Family History: Patient  denies cardiac, vascular or early deaths in family; states everyone is healthy  Social History: Denies past or current drug use; drinks ~3-4 EtOH drinks per week; smokes 3-4 cigars per day.  Review of Systems: A complete ROS was negative except as per HPI.   Physical Exam: Blood pressure 106/60, pulse (!) 56, temperature 99.3 F (37.4 C), temperature source Oral, resp. rate 17, height 5' 9"  (1.753 m), weight 193 lb (87.5 kg), SpO2 100 %.  Physical Exam  Constitutional: He is oriented to person, place, and time. He appears well-developed and well-nourished.  Appears uncomfortable  HENT:  Head: Normocephalic and atraumatic.  Mouth/Throat: No oropharyngeal exudate.  Eyes: Conjunctivae and EOM are normal. Pupils are equal, round, and reactive to light.  Neck: Normal range of motion. Neck supple.  No meningismus  Cardiovascular: Normal rate, regular rhythm and intact distal pulses.  Exam reveals no gallop and no friction rub.   No murmur heard. Pulmonary/Chest: Effort normal and breath sounds normal. No respiratory distress. He has no wheezes. He has no rales.  Abdominal: Soft. Bowel sounds are normal. He exhibits no distension. There is no tenderness.  Musculoskeletal:  Spinal tenderness throughout lumbar spine with associated paraspinal tenderness; no SI joint tenderness. ROM wnl; does have increased back pain with movement of LEs; strength intact; sensation intact  Neurological: He is alert and oriented to person, place, and time. No cranial nerve deficit or sensory deficit. He exhibits normal muscle tone.  Skin: Skin is warm and dry. No rash noted. He is not diaphoretic.  Psychiatric: He has a normal mood and affect.    CBC Latest Ref Rng & Units 06/03/2016  WBC 4.0 - 10.5 K/uL 9.9  Hemoglobin 13.0 - 17.0 g/dL 15.9  Hematocrit 39.0 - 52.0 % 46.6  Platelets 150 - 400 K/uL 216   CMP Latest Ref Rng & Units 06/03/2016  Glucose 65 - 99 mg/dL 124(H)  BUN 6 - 20 mg/dL 11  Creatinine  0.61 - 1.24 mg/dL 1.15  Sodium 135 - 145 mmol/L 135  Potassium 3.5 - 5.1 mmol/L 4.1  Chloride 101 - 111 mmol/L 103  CO2 22 - 32 mmol/L 23  Calcium 8.9 - 10.3 mg/dL 9.4  Total Protein 6.5 - 8.1 g/dL 7.1  Total Bilirubin 0.3 - 1.2 mg/dL 0.3  Alkaline Phos 38 - 126 U/L 55  AST 15 - 41 U/L 26  ALT 17 - 63 U/L 20   Component     Latest Ref Rng & Units 06/03/2016  Total  Protein, CSF     15 - 45 mg/dL 44   Component     Latest Ref Rng & Units 06/03/2016  Glucose, CSF     40 - 70 mg/dL 70   Component     Latest Ref Rng & Units 06/03/2016         2:12 PM  Tube #      1  Color, CSF     COLORLESS PINK (A)  Appearance, CSF     CLEAR CLOUDY (A)  Supernatant      COLORLESS  RBC Count, CSF     0 /cu mm 9,825 (H)  WBC, CSF     0 - 5 /cu mm 5  Other Cells, CSF      TOO FEW TO COUNT, SMEAR AVAILABLE FOR REVIEW   Component     Latest Ref Rng & Units 06/03/2016         2:12 PM  Tube #      4  Color, CSF     COLORLESS COLORLESS  Appearance, CSF     CLEAR HAZY (A)  Supernatant      NOT INDICATED  RBC Count, CSF     0 /cu mm 1,095 (H)  WBC, CSF     0 - 5 /cu mm 2  Other Cells, CSF      TOO FEW TO COUNT, SMEAR AVAILABLE FOR REVIEW   CT Head w/o Contrast 06/03/2016 IMPRESSION: Normal head CT.  MRI T and L Spine w/ and w/o Contrast 06/03/2016 IMPRESSION: 1. Normal MRI of the thoracic spine without and with contrast. 2. Left paramedian disc protrusion at L4-5 with moderate left and mild right subarticular narrowing. 3. Mild foraminal narrowing bilaterally at L4-5 is worse on the left. 4. No pathologic enhancement of the thoracic or lumbar spine to suggest infection.  Assessment & Plan by Problem: Active Problems:   Fever in adult  Fever of unknown origin: Patient with no prior medical history presenting with acute onset of fever, headache and back pain concerning for meningitis, discitis. --Tylenol PRN  --Continue vanc/rocephin for now  --f/u BCx and CSF Cx --f/u  ESR, CRP, flu panel  Headache: Patient with new onset headache with photophobia and fever to 101.3 with otherwise no neurological deficits, no pertinent personal of family history. CT head was negative for acute process which is reassuring. CSF fluid (collected hours after starting Abx) did not show infection, but patient  could still be having aseptic meningitis, encephalitis.  --HIV and Hep C screen --toradol q6hr PRN --continue vanc/rocephin for empiric coverage --f/u BCx and CSF Cx  Back pain: Pain is reproducible on exam and associated with paraspinal tightness and tenderness. MRI is positive for mild L4-L5 disc protrusion but unlikely to be acute and would not have diffuse spinal tenderness usually. MRI was negative for infection.  --toradol q6hr PRN, flexeril 57m TID PRN  Fluids: none Diet: regular DVT Prophylaxis: lovenox Code Status: FULL  Dispo: Admit patient to Observation with expected length of stay less than 2 midnights.  Signed: GAlphonzo Grieve MD 06/03/2016, 9:27 PM  Pager: [phone]

## 2016-06-03 NOTE — ED Triage Notes (Addendum)
Pt reports feeling bad since this am. Having severe headache, generalized back pain and states "his body feels hot all over." denies n/v. Denies urinary symptoms this morning.

## 2016-06-03 NOTE — ED Notes (Signed)
No answer from RN on floor. Let clerk know that bedside report will be given.

## 2016-06-03 NOTE — ED Notes (Signed)
Patient transported to MRI 

## 2016-06-03 NOTE — ED Provider Notes (Signed)
[facility]-EMERGENCY DEPT Provider Note   CSN: [id number] Arrival date & time: 06/03/16  0801     History   Chief Complaint Chief Complaint  Patient presents with  . Back Pain  . Headache    HPI Edward Schroeder is a 36 y.o. male.  HPI  36 year old male presents with severe headache and back pain. Started when he woke up at 6 am and has persisted since. Has been 10/10 since awakening and not worsening or improving. Has not taken anything for this. At first denies being ill the night before but now states that he did have some chills starting last night. There has been no nausea, vomiting, abdominal pain, chest pain, cough, congestion, or sore throat. The back pain is thoracic and lumbar. States he thinks he injured his back several months ago but never had pain like this. He strictly denies illicit drug use or known medical problems. Does smoke and occasionally has alcohol. Denies any neck stiffness or neck pain. The headache is diffuse and is causing photophobia. Denies any extremity weakness or numbness but moving his legs makes his back hurt. Found to be febrile in triage, he did not know he had a fever. He denies bowel/bladder incontinence  History reviewed. No pertinent past medical history.  Patient Active Problem List   Diagnosis Date Noted  . Fever in adult 06/03/2016    Past Surgical History:  Procedure Laterality Date  . I&D EXTREMITY Left 08/20/2015   Procedure: IRRIGATION AND DEBRIDEMENT OF FIRST SECOND AND THIRD TOE FRACTURE;  Surgeon: Toni ArthursJohn Hewitt, MD;  Location: [facility] OR;  Service: Orthopedics;  Laterality: Left;       Home Medications    Prior to Admission medications   Medication Sig Start Date End Date Taking? Authorizing Provider  acetaminophen (TYLENOL) 325 MG tablet Take 2 tablets (650 mg total) by mouth every 6 (six) hours as needed for mild pain or moderate pain. Patient not taking: Reported on 06/03/2016 08/20/15   Toni ArthursJohn Hewitt, MD  cephALEXin (KEFLEX) 500 MG capsule  Take 1 capsule (500 mg total) by mouth 4 (four) times daily. Patient not taking: Reported on 06/03/2016 08/20/15   Toni ArthursJohn Hewitt, MD  naproxen sodium (ALEVE) 220 MG tablet Take 2 tablets (440 mg total) by mouth 2 (two) times daily with a meal. For 3-5 days after surgery. Patient not taking: Reported on 06/03/2016 08/20/15   Toni ArthursJohn Hewitt, MD  oxyCODONE (ROXICODONE) 5 MG immediate release tablet Take 1-2 tablets (5-10 mg total) by mouth every 6 (six) hours as needed for severe pain. Patient not taking: Reported on 06/03/2016 08/20/15   Toni ArthursJohn Hewitt, MD    Family History History reviewed. No pertinent family history.  Social History Social History  Substance Use Topics  . Smoking status: Current Every Day Smoker    Types: Cigars  . Smokeless tobacco: Not on file  . Alcohol use No     Allergies   Patient has no known allergies.   Review of Systems Review of Systems  Constitutional: Positive for chills. Negative for fever.  HENT: Negative for congestion and sore throat.   Eyes: Positive for photophobia. Negative for visual disturbance.  Respiratory: Negative for cough and shortness of breath.   Cardiovascular: Negative for chest pain.  Gastrointestinal: Negative for abdominal pain, diarrhea, nausea and vomiting.  Genitourinary: Negative for dysuria.  Musculoskeletal: Positive for back pain. Negative for neck pain and neck stiffness.  Neurological: Positive for headaches. Negative for weakness and numbness.  All other systems reviewed and are  negative.    Physical Exam Updated Vital Signs BP 106/60 (BP Location: Right Arm)   Pulse (!) 56   Temp 99.3 F (37.4 C) (Oral)   Resp 17   Ht 5\' 9"  (1.753 m)   Wt 193 lb (87.5 kg)   SpO2 100%   BMI 28.50 kg/m   Physical Exam  Constitutional: He is oriented to person, place, and time. He appears well-developed and well-nourished. No distress.  HENT:  Head: Normocephalic and atraumatic.  Right Ear: External ear normal.  Left Ear: External  ear normal.  Nose: Nose normal.  Mouth/Throat: Oropharynx is clear and moist.  Eyes: EOM are normal. Pupils are equal, round, and reactive to light. Right eye exhibits no discharge. Left eye exhibits no discharge.  Neck: Normal range of motion. Neck supple.  Full active ROM without meningismus  Cardiovascular: Normal rate, regular rhythm and normal heart sounds.   Pulmonary/Chest: Effort normal and breath sounds normal. He has no wheezes. He has no rales.  Abdominal: Soft. He exhibits no distension. There is no tenderness.  Musculoskeletal: He exhibits no edema.       Cervical back: He exhibits no tenderness.       Thoracic back: He exhibits bony tenderness.       Lumbar back: He exhibits bony tenderness.  Midline thoracic/lumbar pain, no lateralizing tenderness  Neurological: He is alert and oriented to person, place, and time.  CN 3-12 grossly intact. 5/5 strength in all 4 extremities. Grossly normal sensation. Normal finger to nose.   Skin: Skin is warm and dry. He is not diaphoretic.  Nursing note and vitals reviewed.    ED Treatments / Results  Labs (all labs ordered are listed, but only abnormal results are displayed) Labs Reviewed  COMPREHENSIVE METABOLIC PANEL - Abnormal; Notable for the following:       Result Value   Glucose, Bld 124 (*)    All other components within normal limits  CBC WITH DIFFERENTIAL/PLATELET - Abnormal; Notable for the following:    Neutro Abs 8.6 (*)    All other components within normal limits  RAPID URINE DRUG SCREEN, HOSP PERFORMED - Abnormal; Notable for the following:    Opiates POSITIVE (*)    All other components within normal limits  URINALYSIS, ROUTINE W REFLEX MICROSCOPIC - Abnormal; Notable for the following:    Specific Gravity, Urine 1.032 (*)    All other components within normal limits  CSF CELL COUNT WITH DIFFERENTIAL - Abnormal; Notable for the following:    Color, CSF PINK (*)    Appearance, CSF CLOUDY (*)    RBC Count, CSF  9,825 (*)    All other components within normal limits  CSF CELL COUNT WITH DIFFERENTIAL - Abnormal; Notable for the following:    Appearance, CSF HAZY (*)    RBC Count, CSF 1,095 (*)    All other components within normal limits  I-STAT CG4 LACTIC ACID, ED - Abnormal; Notable for the following:    Lactic Acid, Venous 2.12 (*)    All other components within normal limits  CSF CULTURE  CULTURE, BLOOD (ROUTINE X 2)  CULTURE, BLOOD (ROUTINE X 2)  GLUCOSE, CSF  PROTEIN, CSF    EKG  EKG Interpretation None       Radiology Ct Head Wo Contrast  Result Date: 06/03/2016 CLINICAL DATA:  Headache with back pain and fever. EXAM: CT HEAD WITHOUT CONTRAST TECHNIQUE: Contiguous axial images were obtained from the base of the skull through the vertex without  intravenous contrast. COMPARISON:  None. FINDINGS: Brain: No mass lesion, hemorrhage, hydrocephalus, acute infarct, intra-axial, or extra-axial fluid collection. Vascular: No hyperdense vessel or unexpected calcification. Skull: Normal Sinuses/Orbits: Normal orbits and globes. Clear paranasal sinuses and mastoid air cells. Other: None IMPRESSION: Normal head CT. Electronically Signed   By: Jeronimo Greaves M.D.   On: 06/03/2016 13:51   Mr Thoracic Spine W Wo Contrast  Result Date: 06/03/2016 CLINICAL DATA:  Acute onset of back pain. EXAM: MRI THORACIC AND LUMBAR SPINE WITHOUT AND WITH CONTRAST TECHNIQUE: Multiplanar and multiecho pulse sequences of the thoracic and lumbar spine were obtained without and with intravenous contrast. CONTRAST:  18mL MULTIHANCE GADOBENATE DIMEGLUMINE 529 MG/ML IV SOLN FINDINGS: MRI THORACIC SPINE FINDINGS Alignment: AP alignment is anatomic. There is no significant scoliosis. Vertebrae: Marrow signal and vertebral body heights are normal. The postcontrast images demonstrate no pathologic enhancement. Cord:  Normal signal is present throughout the thoracic spinal cord. Paraspinal and other soft tissues: Paraspinous soft  tissues are within normal limits. Musculature is unremarkable. Limited imaging of the lungs and upper abdomen are within normal limits. Disc levels: No focal disc protrusion or stenosis is evident. The foramina are widely patent bilaterally. MRI LUMBAR SPINE FINDINGS Segmentation: 5 non rib-bearing lumbar type vertebral bodies are present. Alignment:  AP alignment is anatomic. Vertebrae: Marrow signal and vertebral body heights are normal. Postcontrast images demonstrate no pathologic enhancement. Conus medullaris: Extends to the L2 level and appears normal. Paraspinal and other soft tissues: Limited imaging of the abdomen is unremarkable. The paraspinous musculature is normal. Disc levels: The disc levels at L3-4 and above are normal. L4-5: A left paramedian disc protrusion results in moderate left and mild right subarticular narrowing. Mild foraminal narrowing is worse on the left as well. L5-S1:  Slight disc bulging is present without significant stenosis. IMPRESSION: 1. Normal MRI of the thoracic spine without and with contrast. 2. Left paramedian disc protrusion at L4-5 with moderate left and mild right subarticular narrowing. 3. Mild foraminal narrowing bilaterally at L4-5 is worse on the left. 4. No pathologic enhancement of the thoracic or lumbar spine to suggest infection. Electronically Signed   By: Marin Roberts M.D.   On: 06/03/2016 12:22   Mr Lumbar Spine W Wo Contrast  Result Date: 06/03/2016 CLINICAL DATA:  Acute onset of back pain. EXAM: MRI THORACIC AND LUMBAR SPINE WITHOUT AND WITH CONTRAST TECHNIQUE: Multiplanar and multiecho pulse sequences of the thoracic and lumbar spine were obtained without and with intravenous contrast. CONTRAST:  18mL MULTIHANCE GADOBENATE DIMEGLUMINE 529 MG/ML IV SOLN FINDINGS: MRI THORACIC SPINE FINDINGS Alignment: AP alignment is anatomic. There is no significant scoliosis. Vertebrae: Marrow signal and vertebral body heights are normal. The postcontrast images  demonstrate no pathologic enhancement. Cord:  Normal signal is present throughout the thoracic spinal cord. Paraspinal and other soft tissues: Paraspinous soft tissues are within normal limits. Musculature is unremarkable. Limited imaging of the lungs and upper abdomen are within normal limits. Disc levels: No focal disc protrusion or stenosis is evident. The foramina are widely patent bilaterally. MRI LUMBAR SPINE FINDINGS Segmentation: 5 non rib-bearing lumbar type vertebral bodies are present. Alignment:  AP alignment is anatomic. Vertebrae: Marrow signal and vertebral body heights are normal. Postcontrast images demonstrate no pathologic enhancement. Conus medullaris: Extends to the L2 level and appears normal. Paraspinal and other soft tissues: Limited imaging of the abdomen is unremarkable. The paraspinous musculature is normal. Disc levels: The disc levels at L3-4 and above are normal. L4-5: A left  paramedian disc protrusion results in moderate left and mild right subarticular narrowing. Mild foraminal narrowing is worse on the left as well. L5-S1:  Slight disc bulging is present without significant stenosis. IMPRESSION: 1. Normal MRI of the thoracic spine without and with contrast. 2. Left paramedian disc protrusion at L4-5 with moderate left and mild right subarticular narrowing. 3. Mild foraminal narrowing bilaterally at L4-5 is worse on the left. 4. No pathologic enhancement of the thoracic or lumbar spine to suggest infection. Electronically Signed   By: Marin Roberts M.D.   On: 06/03/2016 12:22   Dg Chest Portable 1 View  Result Date: 06/03/2016 CLINICAL DATA:  Pt c/o severe headache, fever, generalized back pain, and feeling 'hot all over' since this am. 1 image. EXAM: PORTABLE CHEST 1 VIEW COMPARISON:  None. FINDINGS: The heart size and mediastinal contours are within normal limits. Both lungs are clear. The visualized skeletal structures are unremarkable. IMPRESSION: No active disease.  Electronically Signed   By: Gaylyn Rong M.D.   On: 06/03/2016 10:21    Procedures .Lumbar Puncture Date/Time: 06/03/2016 2:18 PM Performed by: Pricilla Loveless Authorized by: Pricilla Loveless   Consent:    Consent obtained:  Verbal and written   Consent given by:  Patient   Risks discussed:  Bleeding, headache, nerve damage, infection and pain   Alternatives discussed:  No treatment Pre-procedure details:    Procedure purpose:  Diagnostic   Preparation: Patient was prepped and draped in usual sterile fashion   Anesthesia (see MAR for exact dosages):    Anesthesia method:  Local infiltration   Local anesthetic:  Lidocaine 1% w/o epi Procedure details:    Lumbar space:  L4-L5 interspace   Patient position:  L lateral decubitus   Needle gauge:  20   Needle length (in):  1.5   Ultrasound guidance: no     Number of attempts:  2   Fluid appearance:  Bloody and blood-tinged then clearing   Tubes of fluid:  4   Total volume (ml):  4 Post-procedure:    Puncture site:  Adhesive bandage applied   Patient tolerance of procedure:  Tolerated well, no immediate complications   (including critical care time)  Medications Ordered in ED Medications  lidocaine-EPINEPHrine (XYLOCAINE W/EPI) 1 %-1:100000 (with pres) injection 20 mL (20 mLs Infiltration Not Given 06/03/16 1457)  acetaminophen (TYLENOL) tablet 1,000 mg (1,000 mg Oral Given 06/03/16 0958)  sodium chloride 0.9 % bolus 1,000 mL (0 mLs Intravenous Stopped 06/03/16 1441)  HYDROmorphone (DILAUDID) injection 1 mg (1 mg Intravenous Given 06/03/16 1003)  cefTRIAXone (ROCEPHIN) 2 g in dextrose 5 % 50 mL IVPB (0 g Intravenous Stopped 06/03/16 1221)  vancomycin (VANCOCIN) 1,500 mg in sodium chloride 0.9 % 500 mL IVPB (0 mg Intravenous Stopped 06/03/16 1441)  gadobenate dimeglumine (MULTIHANCE) injection 18 mL (18 mLs Intravenous Contrast Given 06/03/16 1209)  sodium chloride 0.9 % bolus 1,000 mL (0 mLs Intravenous Stopped 06/03/16 1729)      Initial Impression / Assessment and Plan / ED Course  I have reviewed the triage vital signs and the nursing notes.  Pertinent labs & imaging results that were available during my care of the patient were reviewed by me and considered in my medical decision making (see chart for details).  Clinical Course as of Jun 03 1818  Wynelle Link Jun 03, 2016  0930 Patient is neurologically intact. Given his back hurts worse than head and he has no meningismus, will start with MRI T/L spine to r/o discitis/abscess  as cause. If this is negative, will then do LP but wouldn't want to LP through abscess. I think meningitis is less likely but given there will be a delay in definitive diagnosis, will start broad antibiotics in case it is.  [SG]  1240 After discussion of risks/benefits, will get LP given T/L spine MRI is negative. Feels better at this time.  [SG]    Clinical Course User Index [SG] Pricilla Loveless, MD    MRI negative for acute infection. LP c/w traumatic tap. Given no other source, still concern for CNS source such as encephalitis/aseptic meningitis. Antibiotics were given multiple hours prior to LP, thus will admit for obs. Admitted to internal medicine teaching service. No other source or symptoms for fever. Has improved, HA much better at this time.  Final Clinical Impressions(s) / ED Diagnoses   Final diagnoses:  Acute back pain  Fever in adult    New Prescriptions New Prescriptions   No medications on file     Pricilla Loveless, MD 06/03/16 1821

## 2016-06-03 NOTE — ED Notes (Signed)
Report attempted 

## 2016-06-04 DIAGNOSIS — R51 Headache: Secondary | ICD-10-CM

## 2016-06-04 DIAGNOSIS — M549 Dorsalgia, unspecified: Secondary | ICD-10-CM

## 2016-06-04 DIAGNOSIS — J111 Influenza due to unidentified influenza virus with other respiratory manifestations: Principal | ICD-10-CM

## 2016-06-04 LAB — INFLUENZA PANEL BY PCR (TYPE A & B)
Influenza A By PCR: POSITIVE — AB
Influenza B By PCR: NEGATIVE

## 2016-06-04 LAB — LACTIC ACID, PLASMA: Lactic Acid, Venous: 1.1 mmol/L (ref 0.5–1.9)

## 2016-06-04 LAB — HIV ANTIBODY (ROUTINE TESTING W REFLEX): HIV Screen 4th Generation wRfx: NONREACTIVE

## 2016-06-04 MED ORDER — OSELTAMIVIR PHOSPHATE 75 MG PO CAPS: 75.0000 mg | ORAL_CAPSULE | Freq: Two times a day (BID) | ORAL | 0 refills | Status: DC

## 2016-06-04 MED ORDER — OSELTAMIVIR PHOSPHATE 75 MG PO CAPS
75.0000 mg | ORAL_CAPSULE | Freq: Two times a day (BID) | ORAL | Status: DC
  Administered 2016-06-04: 75 mg via ORAL
  Filled 2016-06-04: qty 1

## 2016-06-04 NOTE — Discharge Instructions (Signed)
You were admitted to the hospital for headache, back pain, and fevers, and we made sure that nothing dangerous, like meningitis, was causing these symptoms.  We did find that you have the flu, which explains the way you're feeling.    We started you on Tamiflu, which I have prescribed for a few more days after you leave the hospital.  Make sure you drink lots of water or gatorade to stay hydrated.  You can take Tylenol (no more than 3000 mg in one day) or ibuprofen for headaches, body aches, and fevers.  [facility] tomorrow and ask to schedule a hospital follow-up appointment.

## 2016-06-04 NOTE — Progress Notes (Signed)
   Subjective: Headache improved and no further back pain.  Feel ready to go home.  Objective:  Vital signs in last 24 hours: Vitals:   06/03/16 1819 06/04/16 0003 06/04/16 0529 06/04/16 0704  BP: 106/60 (!) 121/59 119/61   Pulse: (!) 56 (!) 53 (!) 54   Resp: 17  (!) 24   Temp: 99.3 F (37.4 C) 98.1 F (36.7 C) (!) 100.8 F (38.2 C) 99 F (37.2 C)  TempSrc: Oral Oral Oral Oral  SpO2: 100% 97% 95%   Weight:      Height:       CBC Latest Ref Rng & Units 06/03/2016  WBC 4.0 - 10.5 K/uL 9.9  Hemoglobin 13.0 - 17.0 g/dL 40.915.9  Hematocrit 81.139.0 - 52.0 % 46.6  Platelets 150 - 400 K/uL 216   CMP Latest Ref Rng & Units 06/03/2016  Glucose 65 - 99 mg/dL 914(N124(H)  BUN 6 - 20 mg/dL 11  Creatinine 8.290.61 - 5.621.24 mg/dL 1.301.15  Sodium 865135 - 784145 mmol/L 135  Potassium 3.5 - 5.1 mmol/L 4.1  Chloride 101 - 111 mmol/L 103  CO2 22 - 32 mmol/L 23  Calcium 8.9 - 10.3 mg/dL 9.4  Total Protein 6.5 - 8.1 g/dL 7.1  Total Bilirubin 0.3 - 1.2 mg/dL 0.3  Alkaline Phos 38 - 126 U/L 55  AST 15 - 41 U/L 26  ALT 17 - 63 U/L 20   Component     Latest Ref Rng & Units 06/03/2016  Influenza A By PCR     NEGATIVE POSITIVE (A)  Influenza B By PCR     NEGATIVE NEGATIVE    Assessment/Plan:  Principal Problem:   Influenza Active Problems:   Fever in adult   Headache   Acute back pain   36 y.o. male with fevers, headache, and myalgias due to influenza infection.  Meningitis ruled out in ED with normal LP, no paraspinal abscess on MRI.  Treating with oseltamavir and supportive care.  Low risk of influenza complications in healthy young person, symptomatically improving and ready to convalesce at home.  #Fever #Influenza Infection -Oseltamavir 75 mg BID 5 days -Tylenol PRN -No further Abx -f/u HIV, HCV -Encourage PO hydration  Fluids: none Diet: regular DVT Prophylaxis: lovenox Code Status: full  Dispo: Anticipated discharge today.  Alm BustardMatthew O'Sullivan, MD 06/04/2016, 12:54 PM Pager: [phone]

## 2016-06-04 NOTE — Progress Notes (Signed)
Chip BoerVicki, RN stated okay for patient to discharge.    Edward Schroeder to be D/C'd Home per MD order.  Discussed with the patient and all questions fully answered.   An After Visit Summary was printed and given to the patient. Patient received prescription.  D/c education completed with patient/family including follow up instructions, medication list, d/c activities limitations if indicated, with other d/c instructions as indicated by MD - patient able to verbalize understanding, all questions fully answered.   Patient instructed to return to ED, call 911, or call MD for any changes in condition.     Elizabethanne Lusher C 06/04/2016 12:43 PM

## 2016-06-04 NOTE — Discharge Summary (Signed)
Name: Edward Schroeder MRN: [id number] DOB: [birth year] 36 y.o. PCP: No Pcp Per Patient  Date of Admission: 06/03/2016  9:01 AM Date of Discharge: 06/04/2016 Attending Physician: Edward Aliasuncan Thomas Vincent, MD  Discharge Diagnosis:  Principal Problem:   Influenza Active Problems:   Fever in adult   Headache   Acute back pain   Discharge Medications: Allergies as of 06/04/2016   No Known Allergies     Medication List    STOP taking these medications   cephALEXin 500 MG capsule Commonly known as:  KEFLEX   oxyCODONE 5 MG immediate release tablet Commonly known as:  ROXICODONE     TAKE these medications   acetaminophen 325 MG tablet Commonly known as:  TYLENOL Take 2 tablets (650 mg total) by mouth every 6 (six) hours as needed for mild pain or moderate pain.   naproxen sodium 220 MG tablet Commonly known as:  ALEVE Take 2 tablets (440 mg total) by mouth 2 (two) times daily with a meal. For 3-5 days after surgery.   oseltamivir 75 MG capsule Commonly known as:  TAMIFLU Take 1 capsule (75 mg total) by mouth 2 (two) times daily.       Disposition and follow-up:   Edward Schroeder was discharged from [facility] in Good condition.  At the hospital follow up visit please address:  1.  Influenza.  Ensure improvement of symptoms as would be expected with influenza.  Evaluate for meningismus, neurologic abnormalities.  2.  Labs / imaging needed at time of follow-up: none  3.  Pending labs/ test needing follow-up: CSF culture  Follow-up Appointments: Follow-up Information    [facility]. Call in 2 week(s).   Contact information: [address] [place] [id number] [phone]          Hospital Course by problem list: Principal Problem:   Influenza Active Problems:   Fever in adult   Headache   Acute back pain   1. Influenza Mr Edward Schroeder is a healthy 36 year old man who presented to the ED  with acute onset fever, headache, and back pain.  In the ED, meningitis and paraspinal abscess were ruled out with normal head and spine imaging and a normal LP.  He received 2 doses of empiric ceftriaxone and vancomycin before he was discovered to have influenza. He was treated symptomatically with Tylenol, IVF, and NSAIDs and felt much improved by the day after admission.  He was discharged home to complete course of Tamiflu and instructed to follow-up with [facility].  Discharge Vitals:   BP 119/61 (BP Location: Left Arm)   Pulse (!) 54   Temp 99 F (37.2 C) (Oral)   Resp (!) 24   Ht 5\' 9"  (1.753 m)   Wt 193 lb (87.5 kg)   SpO2 95%   BMI 28.50 kg/m   Pertinent Labs, Studies, and Procedures:   Component     Latest Ref Rng & Units 06/03/2016  Influenza A By PCR     NEGATIVE POSITIVE (A)  Influenza B By PCR     NEGATIVE NEGATIVE   CBC Latest Ref Rng & Units 06/03/2016  WBC 4.0 - 10.5 K/uL 9.9  Hemoglobin 13.0 - 17.0 g/dL 84.615.9  Hematocrit 96.239.0 - 52.0 % 46.6  Platelets 150 - 400 K/uL 216   Component     Latest Ref Rng & Units 06/03/2016  Total  Protein, CSF     15 - 45 mg/dL 44  Component     Latest Ref Rng & Units 06/03/2016  Glucose, CSF     40 - 70 mg/dL 70   Component     Latest Ref Rng & Units 06/03/2016         2:12 PM  Tube #      1  Color, CSF     COLORLESS PINK (A)  Appearance, CSF     CLEAR CLOUDY (A)  Supernatant      COLORLESS  RBC Count, CSF     0 /cu mm 9,825 (H)  WBC, CSF     0 - 5 /cu mm 5  Other Cells, CSF      TOO FEW TO COUNT, SMEAR AVAILABLE FOR REVIEW   Component     Latest Ref Rng & Units 06/03/2016         2:12 PM  Tube #      4  Color, CSF     COLORLESS COLORLESS  Appearance, CSF     CLEAR HAZY (A)  Supernatant      NOT INDICATED  RBC Count, CSF     0 /cu mm 1,095 (H)  WBC, CSF     0 - 5 /cu mm 2  Other Cells, CSF      TOO FEW TO COUNT, SMEAR AVAILABLE FOR REVIEW   HIV, HCV negative  CT Head 06/03/2016 IMPRESSION: Normal head  CT.  MRI Thoracic and Lumbar Spine 06/03/2016 IMPRESSION: 1. Normal MRI of the thoracic spine without and with contrast. 2. Left paramedian disc protrusion at L4-5 with moderate left and mild right subarticular narrowing. 3. Mild foraminal narrowing bilaterally at L4-5 is worse on the left. 4. No pathologic enhancement of the thoracic or lumbar spine to suggest infection.  Discharge Instructions: Discharge Instructions    Diet - low sodium heart healthy    Complete by:  As directed    Increase activity slowly    Complete by:  As directed     You were admitted to the hospital for headache, back pain, and fevers, and we made sure that nothing dangerous, like meningitis, was causing these symptoms.  We did find that you have the flu, which explains the way you're feeling.    We started you on Tamiflu, which I have prescribed for a few more days after you leave the hospital.  Make sure you drink lots of water or gatorade to stay hydrated.  You can take Tylenol (no more than 3000 mg in one day) or ibuprofen for headaches, body aches, and fevers.  Please call [facility] tomorrow and ask to schedule a hospital follow-up appointment.  Signed: Alm Bustard, MD 06/04/2016, 10:59 AM   Pager: [phone]

## 2016-06-04 NOTE — Care Management Note (Signed)
Case Management Note  Patient Details  Name: Edward Schroeder MRN: [id number] Date of Birth: December 04, 1980  Subjective/Objective:                Presents with flu.    Action/Plan: Discharge to home today.  Expected Discharge Date:  06/04/16               Expected Discharge Plan:  Home/Self Care  In-House Referral:     Discharge planning Services  CM Consult, [facility]/ [facility] 06/05/2016 at 8:30 am to establish post hospital follow up appt./PCP. CM made pt aware.  Post Acute Care Choice:    Choice offered to:     DME Arranged:    DME Agency:     HH Arranged:    HH Agency:     Status of Service:  Completed, signed off  If discussed at Long Length of Stay Meetings, dates discussed:    Additional Comments:  [name], [name], RN 06/04/2016, 12:01 PM

## 2016-06-05 LAB — HEPATITIS C ANTIBODY

## 2016-06-06 LAB — CSF CULTURE: CULTURE: NO GROWTH

## 2016-06-06 LAB — CSF CULTURE W GRAM STAIN

## 2016-06-07 ENCOUNTER — Inpatient Hospital Stay: Payer: Self-pay

## 2016-06-08 LAB — CULTURE, BLOOD (ROUTINE X 2)
CULTURE: NO GROWTH
CULTURE: NO GROWTH

## 2016-10-05 ENCOUNTER — Encounter (HOSPITAL_COMMUNITY): Payer: Self-pay | Admitting: *Deleted

## 2016-10-05 ENCOUNTER — Emergency Department (HOSPITAL_COMMUNITY)
Admission: EM | Admit: 2016-10-05 | Discharge: 2016-10-05 | Disposition: A | Discharge status: Home / Self Care | Payer: Self-pay | Attending: Emergency Medicine | Admitting: Emergency Medicine

## 2016-10-05 ENCOUNTER — Emergency Department (HOSPITAL_COMMUNITY): Payer: Self-pay

## 2016-10-05 DIAGNOSIS — Y999 Unspecified external cause status: Secondary | ICD-10-CM | POA: Insufficient documentation

## 2016-10-05 DIAGNOSIS — F17290 Nicotine dependence, other tobacco product, uncomplicated: Secondary | ICD-10-CM | POA: Insufficient documentation

## 2016-10-05 DIAGNOSIS — Y9367 Activity, basketball: Secondary | ICD-10-CM | POA: Insufficient documentation

## 2016-10-05 DIAGNOSIS — S92155A Nondisplaced avulsion fracture (chip fracture) of left talus, initial encounter for closed fracture: Secondary | ICD-10-CM | POA: Insufficient documentation

## 2016-10-05 DIAGNOSIS — Y92310 Basketball court as the place of occurrence of the external cause: Secondary | ICD-10-CM | POA: Insufficient documentation

## 2016-10-05 DIAGNOSIS — X501XXA Overexertion from prolonged static or awkward postures, initial encounter: Secondary | ICD-10-CM | POA: Insufficient documentation

## 2016-10-05 MED ORDER — OXYCODONE-ACETAMINOPHEN 5-325 MG PO TABS
1.0000 | ORAL_TABLET | ORAL | Status: DC | PRN
  Administered 2016-10-05: 1 via ORAL

## 2016-10-05 MED ORDER — OXYCODONE-ACETAMINOPHEN 5-325 MG PO TABS
ORAL_TABLET | ORAL | Status: AC
  Filled 2016-10-05: qty 1

## 2016-10-05 MED ORDER — ACETAMINOPHEN 325 MG PO TABS
650.0000 mg | ORAL_TABLET | Freq: Once | ORAL | Status: AC
  Administered 2016-10-05: 650 mg via ORAL
  Filled 2016-10-05: qty 2

## 2016-10-05 MED ORDER — OXYCODONE-ACETAMINOPHEN 5-325 MG PO TABS
1.0000 | ORAL_TABLET | Freq: Once | ORAL | Status: AC
  Administered 2016-10-05: 1 via ORAL
  Filled 2016-10-05: qty 1

## 2016-10-05 MED ORDER — OXYCODONE-ACETAMINOPHEN 5-325 MG PO TABS: 2.0000 | ORAL_TABLET | ORAL | 0 refills | Status: DC | PRN

## 2016-10-05 MED ORDER — IBUPROFEN 200 MG PO TABS
600.0000 mg | ORAL_TABLET | Freq: Once | ORAL | Status: AC
  Administered 2016-10-05: 16:00:00 600 mg via ORAL
  Filled 2016-10-05: qty 1

## 2016-10-05 NOTE — Progress Notes (Signed)
Orthopedic Tech Progress Note Patient Details:  Dulcy FannyCalvin Varnell Feb 06, 1981 [id number]  Ortho Devices Type of Ortho Device: Ankle Air splint Ortho Device/Splint Location: lle Ortho Device/Splint Interventions: Application RN stated that he provide crutches and that pt can use them;RN also completed order for crutches  Mikey Maffett 10/05/2016, 4:15 PM

## 2016-10-05 NOTE — Discharge Instructions (Signed)
Your x-rays showed an avulsion fracture of your talus.  This will be treated with a splint for at least 4-6 weeks.  You need to be re-evaluated by an orthopedist within 1 week for re-evaluation.  Dr. Carola FrostHandy may alter splint and/or change treatment recommendations.   Take percocet as needed for severe pain every 4-6 hours.  Additionally, take 600 mg ibuprofen + 1000 mg tylenol every 6-8 hours for inflammation and additional pain control.   Ice and elevate as much as possible during the day.

## 2016-10-05 NOTE — ED Triage Notes (Signed)
Pt reports playing bball and landing on ankle, felt something pop. Swelling noted to ankle.

## 2016-10-05 NOTE — ED Provider Notes (Signed)
[facility]-EMERGENCY DEPT Provider Note   CSN: [id number] Arrival date & time: 10/05/16  1410     History   Chief Complaint Chief Complaint  Patient presents with  . Ankle Pain    HPI Edward Schroeder is a 36 y.o. male presents to the ED with sudden onset left ankle pain after landing and twisting his ankle playing basketball around noon today. Associated swelling and decreased range of motion secondary to pain. No numbness or tingling to the toes or foot. No previous injury to the ankle joint.  HPI  History reviewed. No pertinent past medical history.  Patient Active Problem List   Diagnosis Date Noted  . Influenza   . Fever in adult 06/03/2016  . Headache 06/03/2016  . Acute back pain     Past Surgical History:  Procedure Laterality Date  . I&D EXTREMITY Left 08/20/2015   Procedure: IRRIGATION AND DEBRIDEMENT OF FIRST SECOND AND THIRD TOE FRACTURE;  Surgeon: Toni ArthursJohn Hewitt, MD;  Location: [facility] OR;  Service: Orthopedics;  Laterality: Left;       Home Medications    Prior to Admission medications   Medication Sig Start Date End Date Taking? Authorizing Provider  acetaminophen (TYLENOL) 325 MG tablet Take 2 tablets (650 mg total) by mouth every 6 (six) hours as needed for mild pain or moderate pain. Patient not taking: Reported on 06/03/2016 08/20/15   Toni ArthursHewitt, John, MD  naproxen sodium (ALEVE) 220 MG tablet Take 2 tablets (440 mg total) by mouth 2 (two) times daily with a meal. For 3-5 days after surgery. Patient not taking: Reported on 06/03/2016 08/20/15   Toni ArthursHewitt, John, MD  oseltamivir (TAMIFLU) 75 MG capsule Take 1 capsule (75 mg total) by mouth 2 (two) times daily. 06/04/16   Alm Schroeder, Matthew, MD  oxyCODONE-acetaminophen (PERCOCET/ROXICET) 5-325 MG tablet Take 2 tablets by mouth every 4 (four) hours as needed for severe pain. 10/05/16   Liberty HandyGibbons, Thomas Rhude J, PA-C    Family History History reviewed. No pertinent family history.  Social History Social History  Substance Use Topics    . Smoking status: Current Every Day Smoker    Types: Cigars  . Smokeless tobacco: Not on file  . Alcohol use No     Allergies   Patient has no known allergies.   Review of Systems Review of Systems  Musculoskeletal: Positive for arthralgias, gait problem and joint swelling.  Skin: Negative for color change.  Neurological: Negative for numbness.     Physical Exam Updated Vital Signs BP 122/74   Pulse 75   Temp 98.1 F (36.7 C) (Oral)   Resp 16   SpO2 98%   Physical Exam  Constitutional: He is oriented to person, place, and time. He appears well-developed and well-nourished. No distress.  HENT:  Head: Normocephalic and atraumatic.  Eyes: Conjunctivae and EOM are normal. No scleral icterus.  Neck: Normal range of motion.  Cardiovascular: Normal rate, regular rhythm, normal heart sounds and intact distal pulses.   No murmur heard. Pulmonary/Chest: Effort normal and breath sounds normal. He has no wheezes.  Musculoskeletal: Normal range of motion. He exhibits edema and tenderness. He exhibits no deformity.  +Moderate edema to anterior left mid foot with tenderness, no skin abrasions or ecchymosis +Talar tilt, pain with left ankle inversion and eversion +Decreased passive ROM of left ankles 2/2 pain but ROM without crepitus.  +Patient unable to bear weight in ED No bony tenderness over posterior aspect of lateral and medial malleoli, navicular bone or 5th metatarsal base.  Achilles tendon is non tender. Negative anterior and posterior drawer.  Negative syndesmosis squeeze test.   Neurological: He is alert and oriented to person, place, and time.  Skin: Skin is warm and dry. Capillary refill takes less than 2 seconds.  Psychiatric: He has a normal mood and affect. His behavior is normal. Judgment and thought content normal.  Nursing note and vitals reviewed.    ED Treatments / Results  Labs (all labs ordered are listed, but only abnormal results are displayed) Labs  Reviewed - No data to display  EKG  EKG Interpretation None       Radiology Dg Ankle Complete Left  Result Date: 10/05/2016 CLINICAL DATA:  Injury to the ankle with swelling and bruising EXAM: LEFT ANKLE COMPLETE - 3+ VIEW COMPARISON:  08/20/2015 FINDINGS: No dislocation is evident. Ankle mortise is symmetric. There is lateral soft tissue swelling at the foot. Small degenerative changes medially. Questionable tiny loose body medial joint. There is a cortical avulsion fracture of the dorsal anterior talus with overlying soft tissue swelling. IMPRESSION: Acute appearing cortical avulsion fracture of the dorsal anterior talus. Electronically Signed   By: Jasmine Pang M.D.   On: 10/05/2016 14:43    Procedures Procedures (including critical care time)  Medications Ordered in ED Medications  oxyCODONE-acetaminophen (PERCOCET/ROXICET) 5-325 MG per tablet 1 tablet (1 tablet Oral Given 10/05/16 1420)  oxyCODONE-acetaminophen (PERCOCET/ROXICET) 5-325 MG per tablet 1 tablet (1 tablet Oral Given 10/05/16 1559)  acetaminophen (TYLENOL) tablet 650 mg (650 mg Oral Given 10/05/16 1558)  ibuprofen (ADVIL,MOTRIN) tablet 600 mg (600 mg Oral Given 10/05/16 1558)     Initial Impression / Assessment and Plan / ED Course  I have reviewed the triage vital signs and the nursing notes.  Pertinent labs & imaging results that were available during my care of the patient were reviewed by me and considered in my medical decision making (see chart for details).   x-ray shows avulsion fracture of the talus consistent with mechanism of injury and physical exam findings. Left foot is neurovascularly intact. Mildly decreased passive range of motion secondary to pain. Patient will see his in a splint and given crutches and will recommend follow-up with orthopedist within 1 week for reevaluation. Short percocet rx given for severe pain. Reviewed [place] narcotic database, patient has not been prescribed a controlled substance.   ED return precautions given. Pt verbalized understanding and is agreeable with plan.   Pt discussed with supervising physician who is agreeable with ED tx and plan.  Final Clinical Impressions(s) / ED Diagnoses   Final diagnoses:  Closed nondisplaced avulsion fracture of left talus, initial encounter    New Prescriptions New Prescriptions   OXYCODONE-ACETAMINOPHEN (PERCOCET/ROXICET) 5-325 MG TABLET    Take 2 tablets by mouth every 4 (four) hours as needed for severe pain.     Liberty Handy, PA-C 10/05/16 1610    Marily Memos, MD 10/06/16 [phone]

## 2016-11-08 ENCOUNTER — Emergency Department (HOSPITAL_COMMUNITY)
Admission: EM | Admit: 2016-11-08 | Discharge: 2016-11-08 | Disposition: A | Discharge status: Home / Self Care | Payer: Self-pay | Attending: Emergency Medicine | Admitting: Emergency Medicine

## 2016-11-08 DIAGNOSIS — Y929 Unspecified place or not applicable: Secondary | ICD-10-CM | POA: Insufficient documentation

## 2016-11-08 DIAGNOSIS — Y33XXXA Other specified events, undetermined intent, initial encounter: Secondary | ICD-10-CM | POA: Insufficient documentation

## 2016-11-08 DIAGNOSIS — S39012A Strain of muscle, fascia and tendon of lower back, initial encounter: Secondary | ICD-10-CM | POA: Insufficient documentation

## 2016-11-08 DIAGNOSIS — F17290 Nicotine dependence, other tobacco product, uncomplicated: Secondary | ICD-10-CM | POA: Insufficient documentation

## 2016-11-08 DIAGNOSIS — Y9389 Activity, other specified: Secondary | ICD-10-CM | POA: Insufficient documentation

## 2016-11-08 DIAGNOSIS — Y990 Civilian activity done for income or pay: Secondary | ICD-10-CM | POA: Insufficient documentation

## 2016-11-08 MED ORDER — NAPROXEN 500 MG PO TABS: 500.0000 mg | ORAL_TABLET | Freq: Two times a day (BID) | ORAL | 0 refills | Status: DC

## 2016-11-08 MED ORDER — METHOCARBAMOL 500 MG PO TABS: 500.0000 mg | ORAL_TABLET | Freq: Two times a day (BID) | ORAL | 0 refills | Status: DC

## 2016-11-08 MED ORDER — NAPROXEN 250 MG PO TABS
500.0000 mg | ORAL_TABLET | Freq: Once | ORAL | Status: AC
  Administered 2016-11-08: 500 mg via ORAL
  Filled 2016-11-08: qty 2

## 2016-11-08 MED ORDER — METHOCARBAMOL 500 MG PO TABS
500.0000 mg | ORAL_TABLET | Freq: Once | ORAL | Status: AC
  Administered 2016-11-08: 500 mg via ORAL
  Filled 2016-11-08: qty 1

## 2016-11-08 NOTE — ED Provider Notes (Signed)
[facility] Provider Note   CSN: [id number] Arrival date & time: 11/08/16  0741     History   Chief Complaint Chief Complaint  Patient presents with  . Back Pain    HPI Edward Schroeder is a 36 y.o. male.  HPI   Patient is a 36 year old male with no pertinent past medical history presents the ED with complaint of right lower back pain. Patient reports lifting multiple things yesterday at work. He states when he woke up this morning he began having constant aching pain to his right lower back. Denies radiation. Patient states pain is worse with movement, palpation or bending. Denies taking any medications prior to arrival. Pt denies fever, numbness, tingling, saddle anesthesia, loss of bowel or bladder, weakness, CP, SOB, abdominal pain, flank pain, urinary sxs, IVDU, cancer or recent spinal manipulation.   No past medical history on file.  Patient Active Problem List   Diagnosis Date Noted  . Influenza   . Fever in adult 06/03/2016  . Headache 06/03/2016  . Acute back pain     Past Surgical History:  Procedure Laterality Date  . I&D EXTREMITY Left 08/20/2015   Procedure: IRRIGATION AND DEBRIDEMENT OF FIRST SECOND AND THIRD TOE FRACTURE;  Surgeon: Toni ArthursJohn Hewitt, MD;  Location: [facility] OR;  Service: Orthopedics;  Laterality: Left;       Home Medications    Prior to Admission medications   Medication Sig Start Date End Date Taking? Authorizing Provider  ibuprofen (ADVIL,MOTRIN) 200 MG tablet Take 200 mg by mouth every 6 (six) hours as needed for moderate pain.   Yes [provider]  acetaminophen (TYLENOL) 325 MG tablet Take 2 tablets (650 mg total) by mouth every 6 (six) hours as needed for mild pain or moderate pain. Patient not taking: Reported on 06/03/2016 08/20/15   Toni ArthursHewitt, John, MD  methocarbamol (ROBAXIN) 500 MG tablet Take 1 tablet (500 mg total) by mouth 2 (two) times daily. 11/08/16   Barrett HenleNadeau, Annalicia Renfrew Elizabeth, PA-C  naproxen (NAPROSYN) 500 MG tablet Take 1  tablet (500 mg total) by mouth 2 (two) times daily. 11/08/16   Barrett HenleNadeau, Lizvet Chunn Elizabeth, PA-C  naproxen sodium (ALEVE) 220 MG tablet Take 2 tablets (440 mg total) by mouth 2 (two) times daily with a meal. For 3-5 days after surgery. Patient not taking: Reported on 06/03/2016 08/20/15   Toni ArthursHewitt, John, MD  oseltamivir (TAMIFLU) 75 MG capsule Take 1 capsule (75 mg total) by mouth 2 (two) times daily. Patient not taking: Reported on 11/08/2016 06/04/16   Alm Bustard'Sullivan, Matthew, MD  oxyCODONE-acetaminophen (PERCOCET/ROXICET) 5-325 MG tablet Take 2 tablets by mouth every 4 (four) hours as needed for severe pain. Patient not taking: Reported on 11/08/2016 10/05/16   Liberty HandyGibbons, Claudia J, PA-C    Family History No family history on file.  Social History Social History  Substance Use Topics  . Smoking status: Current Every Day Smoker    Types: Cigars  . Smokeless tobacco: Not on file  . Alcohol use No     Allergies   Patient has no known allergies.   Review of Systems Review of Systems  Musculoskeletal: Positive for back pain.  All other systems reviewed and are negative.    Physical Exam Updated Vital Signs BP 106/68 (BP Location: Right Arm)   Pulse (!) 48   Temp 97.9 F (36.6 C) (Oral)   Resp 16   SpO2 96%   Physical Exam  Constitutional: He is oriented to person, place, and time. He appears well-developed and well-nourished.  No distress.  HENT:  Head: Normocephalic and atraumatic.  Eyes: Conjunctivae and EOM are normal. Right eye exhibits no discharge. Left eye exhibits no discharge. No scleral icterus.  Neck: Normal range of motion. Neck supple.  Cardiovascular: Normal rate, regular rhythm, normal heart sounds and intact distal pulses.   Pulmonary/Chest: Effort normal and breath sounds normal. No respiratory distress. He has no wheezes. He has no rales. He exhibits no tenderness.  Abdominal: Soft. He exhibits no distension and no mass. There is no tenderness. There is no rebound and no  guarding.  Musculoskeletal: He exhibits tenderness. He exhibits no edema or deformity.  No midline C, T, or L tenderness. TTP over right lumbar paraspinal muscles. Full range of motion of neck and decreased ROM of back due to reported pain. Full range of motion of bilateral upper and lower extremities, with 5/5 strength. Sensation intact. 2+ radial and PT pulses. Cap refill <2 seconds. Patient able to stand and ambulate without assistance.    Neurological: He is alert and oriented to person, place, and time. He displays normal reflexes. No sensory deficit.  Skin: Skin is warm and dry. Capillary refill takes less than 2 seconds. He is not diaphoretic.  Nursing note and vitals reviewed.    ED Treatments / Results  Labs (all labs ordered are listed, but only abnormal results are displayed) Labs Reviewed - No data to display  EKG  EKG Interpretation None       Radiology No results found.  Procedures Procedures (including critical care time)  Medications Ordered in ED Medications  naproxen (NAPROSYN) tablet 500 mg (500 mg Oral Given 11/08/16 1004)  methocarbamol (ROBAXIN) tablet 500 mg (500 mg Oral Given 11/08/16 1004)     Initial Impression / Assessment and Plan / ED Course  I have reviewed the triage vital signs and the nursing notes.  Pertinent labs & imaging results that were available during my care of the patient were reviewed by me and considered in my medical decision making (see chart for details).     Patient with back pain.  No neurological deficits and normal neuro exam.  Patient can walk but states is painful.  No loss of bowel or bladder control.  No concern for cauda equina.  No fever, night sweats, weight loss, h/o cancer, IVDU.  RICE protocol and pain medicine indicated and discussed with patient.  Discussed return precautions.   Final Clinical Impressions(s) / ED Diagnoses   Final diagnoses:  Strain of lumbar region, initial encounter    New  Prescriptions New Prescriptions   METHOCARBAMOL (ROBAXIN) 500 MG TABLET    Take 1 tablet (500 mg total) by mouth 2 (two) times daily.   NAPROXEN (NAPROSYN) 500 MG TABLET    Take 1 tablet (500 mg total) by mouth 2 (two) times daily.     Barrett Henle, PA-C 11/08/16 1033    Arby Barrette, MD 11/30/16 [phone]

## 2016-11-08 NOTE — ED Triage Notes (Signed)
Pt unloads trucks for living. Woke up this morning with bad lower back pain. No other symptoms. Pt ambulatory.

## 2016-11-08 NOTE — ED Notes (Signed)
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 Denies numbness or tingling in legs. Denies urinary sx.

## 2016-11-08 NOTE — Discharge Instructions (Signed)
Take your medications as prescribed. You may also apply ice and/or heat to affected area for 15-20 minutes 3-4 times daily for additional pain relief. I recommend refraining from doing any heavy lifting, squatting or repetitive movements that exacerbate her symptoms for the next few days. °Follow-up with your primary care provider in the next week if her symptoms have not improved. °Please return to the Emergency Department if symptoms worsen or new onset of denies fever, numbness, tingling, groin anesthesia, loss of bowel or bladder, weakness, chest pain, abdominal pain, vomiting. °

## 2017-01-18 ENCOUNTER — Encounter (HOSPITAL_COMMUNITY): Payer: Self-pay | Admitting: *Deleted

## 2017-01-18 ENCOUNTER — Emergency Department (HOSPITAL_COMMUNITY)
Admission: EM | Admit: 2017-01-18 | Discharge: 2017-01-18 | Disposition: A | Discharge status: Home / Self Care | Payer: Self-pay | Attending: Emergency Medicine | Admitting: Emergency Medicine

## 2017-01-18 DIAGNOSIS — G43A0 Cyclical vomiting, not intractable: Secondary | ICD-10-CM | POA: Insufficient documentation

## 2017-01-18 DIAGNOSIS — R1115 Cyclical vomiting syndrome unrelated to migraine: Secondary | ICD-10-CM

## 2017-01-18 DIAGNOSIS — F17290 Nicotine dependence, other tobacco product, uncomplicated: Secondary | ICD-10-CM | POA: Insufficient documentation

## 2017-01-18 LAB — COMPREHENSIVE METABOLIC PANEL
ALBUMIN: 3.9 g/dL (ref 3.5–5.0)
ALT: 21 U/L (ref 17–63)
AST: 29 U/L (ref 15–41)
Alkaline Phosphatase: 71 U/L (ref 38–126)
Anion gap: 6 (ref 5–15)
BILIRUBIN TOTAL: 0.5 mg/dL (ref 0.3–1.2)
BUN: 10 mg/dL (ref 6–20)
CALCIUM: 9.2 mg/dL (ref 8.9–10.3)
CO2: 26 mmol/L (ref 22–32)
CREATININE: 1.19 mg/dL (ref 0.61–1.24)
Chloride: 107 mmol/L (ref 101–111)
GFR calc Af Amer: >60 mL/min (ref >60)
GFR calc non Af Amer: >60 mL/min (ref >60)
GLUCOSE: 74 mg/dL (ref 65–99)
Potassium: 4.3 mmol/L (ref 3.5–5.1)
SODIUM: 139 mmol/L (ref 135–145)
TOTAL PROTEIN: 6.7 g/dL (ref 6.5–8.1)

## 2017-01-18 LAB — CBC
HCT: 45.4 % (ref 39.0–52.0)
Hemoglobin: 15.4 g/dL (ref 13.0–17.0)
MCH: 31.2 pg (ref 26.0–34.0)
MCHC: 33.9 g/dL (ref 30.0–36.0)
MCV: 92.1 fL (ref 78.0–100.0)
Platelets: 251 10*3/uL (ref 150–400)
RBC: 4.93 MIL/uL (ref 4.22–5.81)
RDW: 13.5 % (ref 11.5–15.5)
WBC: 9.4 10*3/uL (ref 4.0–10.5)

## 2017-01-18 LAB — LIPASE, BLOOD: Lipase: 28 U/L (ref 11–51)

## 2017-01-18 MED ORDER — PROMETHAZINE HCL 25 MG PO TABS: 25.0000 mg | ORAL_TABLET | Freq: Four times a day (QID) | ORAL | 0 refills | Status: DC | PRN

## 2017-01-18 NOTE — ED Notes (Signed)
Called pt name x2 to update vitals, no response.

## 2017-01-18 NOTE — ED Triage Notes (Signed)
Pt reports having several episodes of vomiting last night. This am had vomiting x 1 and reports vomiting blood. Denies diarrhea and no acute distress is noted at triage.

## 2017-01-18 NOTE — ED Notes (Addendum)
Pt reports vomiting yesterday. Pt states he also vomitted today. Pt has not tried anything OTC.   PT AND SIGNIFICANT OTHER LAYING IN BED TOGETHER UPON THIS RN ENTERING ROOM.

## 2017-01-18 NOTE — ED Provider Notes (Signed)
[facility] Provider Note   CSN: [id number] Arrival date & time: 01/18/17  1504     History   Chief Complaint Chief Complaint  Patient presents with  . Emesis    HPI Edward Schroeder is a 36 y.o. male.  HPI   36 year old male presenting for evaluation of vomiting blood. Patient states yesterday he felt nauseous, vomited twice of orange food content. This morning on his way to work, he felt nauseous, vomited once. He noticed frank blood in his vomit and quantify as the third of a cup.  Since then, his nausea has resolved and he is currently not any acute distress. Patient denies having fever, chills, headache, lightheadedness, dizziness, chest pain, shortness of breath, abdominal pain or back pain. Denies any abnormal bleeding such as gum bleeding or nosebleed. He has normal bowel movement without blood. He denies coughing or coughing up blood. Denies any recent sick contact. States he is allergic shrimp but did eat shrimp several days prior without any reaction. He is a smoker, and social drinker. He denies using NSAIDs on a regular basis.  History reviewed. No pertinent past medical history.  Patient Active Problem List   Diagnosis Date Noted  . Influenza   . Fever in adult 06/03/2016  . Headache 06/03/2016  . Acute back pain     Past Surgical History:  Procedure Laterality Date  . I&D EXTREMITY Left 08/20/2015   Procedure: IRRIGATION AND DEBRIDEMENT OF FIRST SECOND AND THIRD TOE FRACTURE;  Surgeon: Toni Arthurs, MD;  Location: [facility] OR;  Service: Orthopedics;  Laterality: Left;       Home Medications    Prior to Admission medications   Medication Sig Start Date End Date Taking? Authorizing Provider  acetaminophen (TYLENOL) 325 MG tablet Take 2 tablets (650 mg total) by mouth every 6 (six) hours as needed for mild pain or moderate pain. Patient not taking: Reported on 06/03/2016 08/20/15   Toni Arthurs, MD  ibuprofen (ADVIL,MOTRIN) 200 MG tablet Take 200 mg by mouth every  6 (six) hours as needed for moderate pain.    [provider]  methocarbamol (ROBAXIN) 500 MG tablet Take 1 tablet (500 mg total) by mouth 2 (two) times daily. 11/08/16   Barrett Henle, PA-C  naproxen (NAPROSYN) 500 MG tablet Take 1 tablet (500 mg total) by mouth 2 (two) times daily. 11/08/16   Barrett Henle, PA-C  naproxen sodium (ALEVE) 220 MG tablet Take 2 tablets (440 mg total) by mouth 2 (two) times daily with a meal. For 3-5 days after surgery. Patient not taking: Reported on 06/03/2016 08/20/15   Toni Arthurs, MD  oseltamivir (TAMIFLU) 75 MG capsule Take 1 capsule (75 mg total) by mouth 2 (two) times daily. Patient not taking: Reported on 11/08/2016 06/04/16   Alm Bustard, MD  oxyCODONE-acetaminophen (PERCOCET/ROXICET) 5-325 MG tablet Take 2 tablets by mouth every 4 (four) hours as needed for severe pain. Patient not taking: Reported on 11/08/2016 10/05/16   Liberty Handy, PA-C    Family History History reviewed. No pertinent family history.  Social History Social History  Substance Use Topics  . Smoking status: Current Every Day Smoker    Types: Cigars  . Smokeless tobacco: Not on file  . Alcohol use No     Allergies   Patient has no known allergies.   Review of Systems Review of Systems  All other systems reviewed and are negative.    Physical Exam Updated Vital Signs BP 105/69 (BP Location: Left Arm)  Pulse (!) 47   Temp 98.5 F (36.9 C) (Oral)   Resp 16   SpO2 98%  hx of bradycardia.  Pt is asymptomatic.  Physical Exam  Constitutional: He appears well-developed and well-nourished. No distress.  HENT:  Head: Atraumatic.  Mouth/Throat: Oropharynx is clear and moist.  Eyes: Conjunctivae are normal.  Neck: Normal range of motion. Neck supple. No JVD present. No tracheal deviation present.  Cardiovascular:  Bradycardia without murmurs rubs or gallops  Pulmonary/Chest: Effort normal and breath sounds normal.  Abdominal:  Soft. Bowel sounds are normal. He exhibits no distension. There is no tenderness.  Lymphadenopathy:    He has no cervical adenopathy.  Neurological: He is alert.  Skin: No rash noted.  Psychiatric: He has a normal mood and affect.  Nursing note and vitals reviewed.    ED Treatments / Results  Labs (all labs ordered are listed, but only abnormal results are displayed) Labs Reviewed  LIPASE, BLOOD  COMPREHENSIVE METABOLIC PANEL  CBC  URINALYSIS, ROUTINE W REFLEX MICROSCOPIC    EKG  EKG Interpretation None       Radiology No results found.  Procedures Procedures (including critical care time)  Medications Ordered in ED Medications - No data to display   Initial Impression / Assessment and Plan / ED Course  I have reviewed the triage vital signs and the nursing notes.  Pertinent labs & imaging results that were available during my care of the patient were reviewed by me and considered in my medical decision making (see chart for details).     BP 105/69 (BP Location: Left Arm)   Pulse (!) 47   Temp 98.5 F (36.9 C) (Oral)   Resp 16   SpO2 98%    Final Clinical Impressions(s) / ED Diagnoses   Final diagnoses:  Non-intractable cyclical vomiting with nausea    New Prescriptions New Prescriptions   PROMETHAZINE (PHENERGAN) 25 MG TABLET    Take 1 tablet (25 mg total) by mouth every 6 (six) hours as needed for nausea.   10:23 PM Pt report vomiting up blood earlier today and now felt better.  He has no active pain.  He is well appearing.  Labs are reassuring.  Will give referral to GI for outpt f/u as needed.  Antinausea med prescribed as needed.  Return precaution given.     Fayrene Helperran, Lister Brizzi, PA-C 01/18/17 2226    Pricilla LovelessGoldston, Scott, MD 01/29/17 [phone]

## 2017-01-23 ENCOUNTER — Encounter (HOSPITAL_COMMUNITY): Payer: Self-pay | Admitting: *Deleted

## 2017-01-23 ENCOUNTER — Emergency Department (HOSPITAL_COMMUNITY)
Admission: EM | Admit: 2017-01-23 | Discharge: 2017-01-23 | Disposition: A | Discharge status: Home / Self Care | Payer: Self-pay | Attending: Emergency Medicine | Admitting: Emergency Medicine

## 2017-01-23 DIAGNOSIS — F17290 Nicotine dependence, other tobacco product, uncomplicated: Secondary | ICD-10-CM | POA: Insufficient documentation

## 2017-01-23 DIAGNOSIS — R112 Nausea with vomiting, unspecified: Secondary | ICD-10-CM | POA: Insufficient documentation

## 2017-01-23 DIAGNOSIS — R110 Nausea: Secondary | ICD-10-CM

## 2017-01-23 LAB — I-STAT CHEM 8, ED
BUN: 16 mg/dL (ref 6–20)
Calcium, Ion: 0.91 mmol/L — ABNORMAL LOW (ref 1.15–1.40)
Chloride: 105 mmol/L (ref 101–111)
Creatinine, Ser: 1 mg/dL (ref 0.61–1.24)
Glucose, Bld: 82 mg/dL (ref 65–99)
HCT: 48 % (ref 39.0–52.0)
Hemoglobin: 16.3 g/dL (ref 13.0–17.0)
Potassium: 5.1 mmol/L (ref 3.5–5.1)
Sodium: 137 mmol/L (ref 135–145)
TCO2: 28 mmol/L (ref 22–32)

## 2017-01-23 MED ORDER — ONDANSETRON HCL 4 MG PO TABS
4.0000 mg | ORAL_TABLET | Freq: Once | ORAL | Status: AC
  Administered 2017-01-23: 4 mg via ORAL
  Filled 2017-01-23: qty 1

## 2017-01-23 MED ORDER — ONDANSETRON HCL 4 MG PO TABS: 4.0000 mg | ORAL_TABLET | Freq: Four times a day (QID) | ORAL | 0 refills | Status: DC

## 2017-01-23 NOTE — Discharge Instructions (Signed)
Please read attached information. If you experience any new or worsening signs or symptoms please return to the emergency room for evaluation. Please follow-up with your primary care provider or specialist as discussed. Please use medication prescribed only as directed and discontinue taking if you have any concerning signs or symptoms.   °

## 2017-01-23 NOTE — ED Triage Notes (Signed)
Pt in c/ emesis x 1 & x 2 diarrhea in the last 24 hrs, pt seen here 5 days ago for similar symptoms, c/o dizziness, nausea & diaphoresis, A&O x4

## 2017-01-23 NOTE — ED Provider Notes (Signed)
[facility]-EMERGENCY DEPT Provider Note   CSN: [id number] Arrival date & time: 01/23/17  1441     History   Chief Complaint Chief Complaint  Patient presents with  . Emesis    HPI Edward Schroeder is a 36 y.o. male.  HPI   36 year old male presents today with complaints of nausea.  Patient reports that he was seen in the emergency room at 01/18/2017 for nausea and vomiting.  Patient notes he was discharged, has had some vomiting at home which has improved.  He notes his last episode of vomiting was 5 days ago.  He notes that over the last several days he has had several episodes of nausea the last several seconds and then resolve on their own.  Patient reports decreased appetite, increased stress.  He denies any significant drug or alcohol use, denies any NSAIDs.  She denies any weight loss.  Patient reports he can use to work, enjoys activities outside of work including basketball and exercising.  Patient denies any fever, vomiting, diarrhea.   History reviewed. No pertinent past medical history.  Patient Active Problem List   Diagnosis Date Noted  . Influenza   . Fever in adult 06/03/2016  . Headache 06/03/2016  . Acute back pain     Past Surgical History:  Procedure Laterality Date  . I&D EXTREMITY Left 08/20/2015   Procedure: IRRIGATION AND DEBRIDEMENT OF FIRST SECOND AND THIRD TOE FRACTURE;  Surgeon: Toni Arthurs, MD;  Location: [facility] OR;  Service: Orthopedics;  Laterality: Left;       Home Medications    Prior to Admission medications   Medication Sig Start Date End Date Taking? Authorizing Provider  acetaminophen (TYLENOL) 325 MG tablet Take 2 tablets (650 mg total) by mouth every 6 (six) hours as needed for mild pain or moderate pain. Patient not taking: Reported on 06/03/2016 08/20/15   Toni Arthurs, MD  methocarbamol (ROBAXIN) 500 MG tablet Take 1 tablet (500 mg total) by mouth 2 (two) times daily. 11/08/16   Barrett Henle, PA-C  ondansetron (ZOFRAN) 4 MG tablet Take  1 tablet (4 mg total) by mouth every 6 (six) hours. 01/23/17   Arthelia Callicott, Tinnie Gens, PA-C  oseltamivir (TAMIFLU) 75 MG capsule Take 1 capsule (75 mg total) by mouth 2 (two) times daily. Patient not taking: Reported on 11/08/2016 06/04/16   Alm Bustard, MD  oxyCODONE-acetaminophen (PERCOCET/ROXICET) 5-325 MG tablet Take 2 tablets by mouth every 4 (four) hours as needed for severe pain. Patient not taking: Reported on 11/08/2016 10/05/16   Liberty Handy, PA-C  promethazine (PHENERGAN) 25 MG tablet Take 1 tablet (25 mg total) by mouth every 6 (six) hours as needed for nausea. 01/18/17   Fayrene Helper, PA-C    Family History No family history on file.  Social History Social History  Substance Use Topics  . Smoking status: Current Every Day Smoker    Packs/day: 2.00    Types: Cigars  . Smokeless tobacco: Never Used  . Alcohol use No     Allergies   Patient has no known allergies.   Review of Systems Review of Systems  All other systems reviewed and are negative.    Physical Exam Updated Vital Signs BP 112/69 (BP Location: Left Arm)   Pulse 61   Temp 98.1 F (36.7 C) (Oral)   Resp 12   Ht 5\' 9"  (1.753 m)   Wt 88.5 kg (195 lb)   SpO2 96%   BMI 28.80 kg/m   Physical Exam  Constitutional: He is  oriented to person, place, and time. He appears well-developed and well-nourished.  HENT:  Head: Normocephalic and atraumatic.  Eyes: Pupils are equal, round, and reactive to light. Conjunctivae are normal. Right eye exhibits no discharge. Left eye exhibits no discharge. No scleral icterus.  Neck: Normal range of motion. No JVD present. No tracheal deviation present.  Pulmonary/Chest: Effort normal. No stridor.  Abdominal: Soft. He exhibits no distension and no mass. There is no tenderness. There is no rebound and no guarding. No hernia.  Neurological: He is alert and oriented to person, place, and time. Coordination normal.  Psychiatric: He has a normal mood and affect. His  behavior is normal. Judgment and thought content normal.  Nursing note and vitals reviewed.    ED Treatments / Results  Labs (all labs ordered are listed, but only abnormal results are displayed) Labs Reviewed  I-STAT CHEM 8, ED - Abnormal; Notable for the following:       Result Value   Calcium, Ion 0.91 (*)    All other components within normal limits    EKG  EKG Interpretation None       Radiology No results found.  Procedures Procedures (including critical care time)  Medications Ordered in ED Medications  ondansetron (ZOFRAN) tablet 4 mg (not administered)     Initial Impression / Assessment and Plan / ED Course  I have reviewed the triage vital signs and the nursing notes.  Pertinent labs & imaging results that were available during my care of the patient were reviewed by me and considered in my medical decision making (see chart for details).      Final Clinical Impressions(s) / ED Diagnoses   Final diagnoses:  Nausea    Labs: CBC, BMP  Imaging:  Consults:  Therapeutics: Zofran  Discharge Meds:   Assessment/Plan: Patient presents with complaints of nausea.  He denies any episodes of vomiting, any pain, diarrhea, or infectious etiology.  Uncertain etiology of patient's nausea, highly suspect stress and anxiety, low suspicion for any acute life-threatening pathology.  Patient's laboratory analysis is reassuring, he will be discharged home with outpatient follow-up and strict return precautions.  Patient verbalized understanding and agreement to today's plan had no further questions or concerns the time discharge.   New Prescriptions New Prescriptions   ONDANSETRON (ZOFRAN) 4 MG TABLET    Take 1 tablet (4 mg total) by mouth every 6 (six) hours.     Eyvonne MechanicHedges, Shykeem Resurreccion, PA-C 01/23/17 2118    Tegeler, Canary Brimhristopher J, MD 01/23/17 [phone]

## 2017-01-25 ENCOUNTER — Emergency Department (HOSPITAL_COMMUNITY)
Admission: EM | Admit: 2017-01-25 | Discharge: 2017-01-25 | Disposition: A | Discharge status: Home / Self Care | Payer: Self-pay | Attending: Emergency Medicine | Admitting: Emergency Medicine

## 2017-01-25 ENCOUNTER — Encounter (HOSPITAL_COMMUNITY): Payer: Self-pay

## 2017-01-25 DIAGNOSIS — Z79899 Other long term (current) drug therapy: Secondary | ICD-10-CM | POA: Insufficient documentation

## 2017-01-25 DIAGNOSIS — R55 Syncope and collapse: Secondary | ICD-10-CM | POA: Insufficient documentation

## 2017-01-25 DIAGNOSIS — F17290 Nicotine dependence, other tobacco product, uncomplicated: Secondary | ICD-10-CM | POA: Insufficient documentation

## 2017-01-25 DIAGNOSIS — R61 Generalized hyperhidrosis: Secondary | ICD-10-CM | POA: Insufficient documentation

## 2017-01-25 LAB — CBC
HCT: 46.3 % (ref 39.0–52.0)
Hemoglobin: 15.9 g/dL (ref 13.0–17.0)
MCH: 31.2 pg (ref 26.0–34.0)
MCHC: 34.3 g/dL (ref 30.0–36.0)
MCV: 90.8 fL (ref 78.0–100.0)
PLATELETS: 237 10*3/uL (ref 150–400)
RBC: 5.1 MIL/uL (ref 4.22–5.81)
RDW: 13.2 % (ref 11.5–15.5)
WBC: 8.6 10*3/uL (ref 4.0–10.5)

## 2017-01-25 LAB — BASIC METABOLIC PANEL
Anion gap: 7 (ref 5–15)
BUN: 13 mg/dL (ref 6–20)
CHLORIDE: 104 mmol/L (ref 101–111)
CO2: 26 mmol/L (ref 22–32)
CREATININE: 1.16 mg/dL (ref 0.61–1.24)
Calcium: 9.4 mg/dL (ref 8.9–10.3)
GFR calc Af Amer: >60 mL/min (ref >60)
GFR calc non Af Amer: >60 mL/min (ref >60)
Glucose, Bld: 84 mg/dL (ref 65–99)
Potassium: 4.1 mmol/L (ref 3.5–5.1)
SODIUM: 137 mmol/L (ref 135–145)

## 2017-01-25 MED ORDER — ACETAMINOPHEN 500 MG PO TABS
1000.0000 mg | ORAL_TABLET | Freq: Once | ORAL | Status: DC
Start: 2017-01-25 — End: 2017-01-26

## 2017-01-25 NOTE — ED Provider Notes (Signed)
[facility]PT Provider Note   CSN: [id number] Arrival date & time: 01/25/17  1447     History   Chief Complaint Chief Complaint  Patient presents with  . Loss of Consciousness    HPI Dulcy FannyCalvin Farver is a 36 y.o. male.  HPI Patient reports he is watching television. He reports he suddenly got very sweaty and felt hot. He did not have any associated chest pain, headache or shortness of breath. The patient reports since he felt that way he stood up to go to the refrigerator and put his head in the freezer to cool off. When he got to the refrigerator and open and he got very lightheaded and then woke up on the floor. He reports he doesn't know how long he had passed out. He was home alone. He denies he had any injuries. He was aware of where he was upon awakening. He reports that since the episode he has had something to eat and drink. He reports he feels better now. History reviewed. No pertinent past medical history.  Patient Active Problem List   Diagnosis Date Noted  . Influenza   . Fever in adult 06/03/2016  . Headache 06/03/2016  . Acute back pain     Past Surgical History:  Procedure Laterality Date  . I&D EXTREMITY Left 08/20/2015   Procedure: IRRIGATION AND DEBRIDEMENT OF FIRST SECOND AND THIRD TOE FRACTURE;  Surgeon: Toni ArthursJohn Hewitt, MD;  Location: [facility] OR;  Service: Orthopedics;  Laterality: Left;       Home Medications    Prior to Admission medications   Medication Sig Start Date End Date Taking? Authorizing Provider  acetaminophen (TYLENOL) 325 MG tablet Take 2 tablets (650 mg total) by mouth every 6 (six) hours as needed for mild pain or moderate pain. Patient not taking: Reported on 06/03/2016 08/20/15   Toni ArthursHewitt, John, MD  methocarbamol (ROBAXIN) 500 MG tablet Take 1 tablet (500 mg total) by mouth 2 (two) times daily. 11/08/16   Barrett HenleNadeau, Nicole Elizabeth, PA-C  ondansetron (ZOFRAN) 4 MG tablet Take 1 tablet (4 mg total) by mouth every 6 (six) hours. 01/23/17   Hedges,  Tinnie GensJeffrey, PA-C  oseltamivir (TAMIFLU) 75 MG capsule Take 1 capsule (75 mg total) by mouth 2 (two) times daily. Patient not taking: Reported on 11/08/2016 06/04/16   Alm Bustard'Sullivan, Matthew, MD  oxyCODONE-acetaminophen (PERCOCET/ROXICET) 5-325 MG tablet Take 2 tablets by mouth every 4 (four) hours as needed for severe pain. Patient not taking: Reported on 11/08/2016 10/05/16   Liberty HandyGibbons, Claudia J, PA-C  promethazine (PHENERGAN) 25 MG tablet Take 1 tablet (25 mg total) by mouth every 6 (six) hours as needed for nausea. 01/18/17   Fayrene Helperran, Bowie, PA-C    Family History History reviewed. No pertinent family history.  Social History Social History  Substance Use Topics  . Smoking status: Current Every Day Smoker    Packs/day: 2.00    Types: Cigars  . Smokeless tobacco: Never Used  . Alcohol use No     Allergies   Patient has no known allergies.   Review of Systems Review of Systems 10 Systems reviewed and are negative for acute change except as noted in the HPI.   Physical Exam Updated Vital Signs BP 107/66   Pulse (!) 53   Temp 98.2 F (36.8 C)   Resp 13   Ht 5\' 9"  (1.753 m)   Wt 88.5 kg (195 lb)   SpO2 98%   BMI 28.80 kg/m   Physical Exam  Constitutional: He is oriented  to person, place, and time. He appears well-developed and well-nourished.  HENT:  Head: Normocephalic and atraumatic.  Nose: Nose normal.  Mouth/Throat: Oropharynx is clear and moist.  Eyes: Pupils are equal, round, and reactive to light. Conjunctivae and EOM are normal.  Neck: Neck supple.  Cardiovascular: Normal rate, regular rhythm, normal heart sounds and intact distal pulses.   No murmur heard. Pulmonary/Chest: Effort normal and breath sounds normal. No respiratory distress.  Abdominal: Soft. He exhibits no distension. There is no tenderness. There is no guarding.  Musculoskeletal: Normal range of motion. He exhibits no edema, tenderness or deformity.  Neurological: He is alert and oriented to person,  place, and time. No cranial nerve deficit. He exhibits normal muscle tone. Coordination normal.  Skin: Skin is warm and dry.  Psychiatric: He has a normal mood and affect.  Nursing note and vitals reviewed.    ED Treatments / Results  Labs (all labs ordered are listed, but only abnormal results are displayed) Labs Reviewed  BASIC METABOLIC PANEL  CBC  URINALYSIS, ROUTINE W REFLEX MICROSCOPIC  RAPID URINE DRUG SCREEN, HOSP PERFORMED    EKG  EKG Interpretation  Date/Time:  Friday January 25 2017 15:22:04 EDT Ventricular Rate:  52 PR Interval:  168 QRS Duration: 88 QT Interval:  418 QTC Calculation: 388 R Axis:   95 Text Interpretation:  Sinus bradycardia Rightward axis Early repolarization Borderline ECG agree. no old comparison Confirmed by Arby Barrette [phone]) on 01/25/2017 8:19:47 PM       Radiology No results found.  Procedures Procedures (including critical care time)  Medications Ordered in ED Medications  acetaminophen (TYLENOL) tablet 1,000 mg (not administered)     Initial Impression / Assessment and Plan / ED Course  I have reviewed the triage vital signs and the nursing notes.  Pertinent labs & imaging results that were available during my care of the patient were reviewed by me and considered in my medical decision making (see chart for details).     Final Clinical Impressions(s) / ED Diagnoses   Final diagnoses:  Vasovagal syncope   Patient was syncopal episode after standing quickly. At this time, findings most consistent with vasovagal episode. Patient no preceding headache chest pain or dyspnea. He is clinically well at this time. Instructions for syncope given. Return precautions reviewed. He is instructed to follow-up for recheck next week. New Prescriptions New Prescriptions   No medications on file     Arby Barrette, MD 01/25/17 2146

## 2017-01-25 NOTE — ED Triage Notes (Signed)
Pt endorses having hot flashes this morning while watching a movie, pt went to the refrigerator and stuck his head in it to cool off and remembered waking up in the floor. No obvious injuries, VSS. Neuro intact. Pt evaluated here multiple times earlier this week for n/v.

## 2017-01-30 ENCOUNTER — Emergency Department (HOSPITAL_BASED_OUTPATIENT_CLINIC_OR_DEPARTMENT_OTHER)
Admission: EM | Admit: 2017-01-30 | Discharge: 2017-01-30 | Disposition: A | Discharge status: Home / Self Care | Payer: Self-pay | Attending: Emergency Medicine | Admitting: Emergency Medicine

## 2017-01-30 ENCOUNTER — Emergency Department (HOSPITAL_COMMUNITY)
Admission: EM | Admit: 2017-01-30 | Discharge: 2017-01-30 | Disposition: A | Discharge status: Home / Self Care | Payer: Self-pay

## 2017-01-30 ENCOUNTER — Encounter (HOSPITAL_BASED_OUTPATIENT_CLINIC_OR_DEPARTMENT_OTHER): Payer: Self-pay

## 2017-01-30 DIAGNOSIS — F17290 Nicotine dependence, other tobacco product, uncomplicated: Secondary | ICD-10-CM | POA: Insufficient documentation

## 2017-01-30 DIAGNOSIS — R55 Syncope and collapse: Secondary | ICD-10-CM

## 2017-01-30 LAB — BASIC METABOLIC PANEL
Anion gap: 4 — ABNORMAL LOW (ref 5–15)
BUN: 14 mg/dL (ref 6–20)
CHLORIDE: 105 mmol/L (ref 101–111)
CO2: 27 mmol/L (ref 22–32)
Calcium: 9.3 mg/dL (ref 8.9–10.3)
Creatinine, Ser: 1.02 mg/dL (ref 0.61–1.24)
GFR calc Af Amer: >60 mL/min (ref >60)
GFR calc non Af Amer: >60 mL/min (ref >60)
GLUCOSE: 109 mg/dL — AB (ref 65–99)
POTASSIUM: 3.8 mmol/L (ref 3.5–5.1)
SODIUM: 136 mmol/L (ref 135–145)

## 2017-01-30 LAB — CBC WITH DIFFERENTIAL/PLATELET
Basophils Absolute: 0 10*3/uL (ref 0.0–0.1)
Basophils Relative: 0 %
EOS PCT: 1 %
Eosinophils Absolute: 0.1 10*3/uL (ref 0.0–0.7)
HCT: 44.8 % (ref 39.0–52.0)
Hemoglobin: 15.4 g/dL (ref 13.0–17.0)
LYMPHS ABS: 2.6 10*3/uL (ref 0.7–4.0)
LYMPHS PCT: 25 %
MCH: 31 pg (ref 26.0–34.0)
MCHC: 34.4 g/dL (ref 30.0–36.0)
MCV: 90.3 fL (ref 78.0–100.0)
Monocytes Absolute: 0.8 10*3/uL (ref 0.1–1.0)
Monocytes Relative: 8 %
Neutro Abs: 6.8 10*3/uL (ref 1.7–7.7)
Neutrophils Relative %: 66 %
Platelets: 253 10*3/uL (ref 150–400)
RBC: 4.96 MIL/uL (ref 4.22–5.81)
RDW: 13.5 % (ref 11.5–15.5)
WBC: 10.3 10*3/uL (ref 4.0–10.5)

## 2017-01-30 NOTE — ED Triage Notes (Addendum)
Pt c/o near syncopal episode today while at work-similar c/o x 2 week after starting a new job-seen for same-[facility] ED today-NAD-steady gait

## 2017-01-30 NOTE — ED Notes (Signed)
Pt verbalizes understanding of d/c instructions and denies any further needs at this time. 

## 2017-01-30 NOTE — ED Provider Notes (Signed)
[facility]   CSN: [id number] Arrival date & time: 01/30/17  1818     History   Chief Complaint Chief Complaint  Patient presents with  . Near Syncope    HPI Edward Schroeder is a 36 y.o. male.  HPI  36 year old male presents with near syncope. He states that a couple hours ago he was at work and while he was in a meeting he all of a sudden felt diaphoretic. About a minute later he had an large episode of emesis. He then felt lightheaded for a few minutes and then back to normal. He states he passed out last week and has had intermittent vomiting spontaneously like this for a couple weeks. Has been to the ED multiple times. He never had headache, chest pain, shortness of breath, palpitations, or diarrhea. No abdominal pain. At this point he feels normal. No recent travel, leg swelling, hx of DVT  History reviewed. No pertinent past medical history.  Patient Active Problem List   Diagnosis Date Noted  . Influenza   . Fever in adult 06/03/2016  . Headache 06/03/2016  . Acute back pain     Past Surgical History:  Procedure Laterality Date  . I&D EXTREMITY Left 08/20/2015   Procedure: IRRIGATION AND DEBRIDEMENT OF FIRST SECOND AND THIRD TOE FRACTURE;  Surgeon: Toni ArthursJohn Hewitt, MD;  Location: [facility] OR;  Service: Orthopedics;  Laterality: Left;       Home Medications    Prior to Admission medications   Not on File    Family History No family history on file.  Social History Social History  Substance Use Topics  . Smoking status: Current Every Day Smoker    Packs/day: 2.00    Types: Cigars  . Smokeless tobacco: Never Used  . Alcohol use Yes     Comment: occ     Allergies   Shrimp [shellfish allergy]   Review of Systems Review of Systems  Respiratory: Negative for shortness of breath.   Cardiovascular: Negative for chest pain, palpitations and leg swelling.  Gastrointestinal: Positive for vomiting. Negative for abdominal pain.    Musculoskeletal: Negative for neck pain.  Neurological: Positive for light-headedness. Negative for weakness, numbness and headaches.  All other systems reviewed and are negative.    Physical Exam Updated Vital Signs BP 130/69 (BP Location: Left Arm)   Pulse (!) 47   Temp 98.3 F (36.8 C) (Oral)   Resp 16   Ht 5\' 9"  (1.753 m)   Wt 86.2 kg (190 lb)   SpO2 99%   BMI 28.06 kg/m   Physical Exam  Constitutional: He is oriented to person, place, and time. He appears well-developed and well-nourished.  HENT:  Head: Normocephalic and atraumatic.  Right Ear: External ear normal.  Left Ear: External ear normal.  Nose: Nose normal.  Eyes: Pupils are equal, round, and reactive to light. EOM are normal. Right eye exhibits no discharge. Left eye exhibits no discharge.  Neck: Normal range of motion. Neck supple.  Cardiovascular: Regular rhythm and normal heart sounds.  Bradycardia present.   Pulmonary/Chest: Effort normal and breath sounds normal.  Abdominal: Soft. There is no tenderness.  Musculoskeletal: He exhibits no edema.  Neurological: He is alert and oriented to person, place, and time.  CN 3-12 grossly intact. 5/5 strength in all 4 extremities. Grossly normal sensation. Normal finger to nose.   Skin: Skin is warm and dry.  Nursing note and vitals reviewed.    ED Treatments / Results  Labs (  all labs ordered are listed, but only abnormal results are displayed) Labs Reviewed  BASIC METABOLIC PANEL - Abnormal; Notable for the following:       Result Value   Glucose, Bld 109 (*)    Anion gap 4 (*)    All other components within normal limits  CBC WITH DIFFERENTIAL/PLATELET    EKG  EKG Interpretation  Date/Time:  Wednesday January 30 2017 18:43:48 EDT Ventricular Rate:  47 PR Interval:  176 QRS Duration: 104 QT Interval:  450 QTC Calculation: 398 R Axis:   94 Text Interpretation:  Sinus bradycardia Rightward axis Borderline ECG no significant change compared to  Sept 7 2018 Confirmed by Pricilla Loveless [phone]) on 01/30/2017 6:47:19 PM       Radiology No results found.  Procedures Procedures (including critical care time)  Medications Ordered in ED Medications - No data to display   Initial Impression / Assessment and Plan / ED Course  I have reviewed the triage vital signs and the nursing notes.  Pertinent labs & imaging results that were available during my care of the patient were reviewed by me and considered in my medical decision making (see chart for details).     Unclear why patient vomited. However vomiting may have induced a near syncopal episode. He appears to be bradycardic at baseline which I think is functional, likely due to being in shape and otherwise healthy. No signs of pathologic bradycardia. He overall feels well. He did not have any symptoms on orthostatics. He will be discharged home with PCP follow-up and discussed return precautions.  Final Clinical Impressions(s) / ED Diagnoses   Final diagnoses:  Near syncope    New Prescriptions New Prescriptions   No medications on file     Pricilla Loveless, MD 01/30/17 2041

## 2017-07-06 ENCOUNTER — Encounter (HOSPITAL_COMMUNITY): Payer: Self-pay | Admitting: Emergency Medicine

## 2017-07-06 ENCOUNTER — Other Ambulatory Visit: Payer: Self-pay

## 2017-07-06 ENCOUNTER — Emergency Department (HOSPITAL_COMMUNITY): Payer: Self-pay

## 2017-07-06 ENCOUNTER — Emergency Department (HOSPITAL_COMMUNITY)
Admission: EM | Admit: 2017-07-06 | Discharge: 2017-07-06 | Disposition: A | Discharge status: Home / Self Care | Payer: Self-pay | Attending: Emergency Medicine | Admitting: Emergency Medicine

## 2017-07-06 DIAGNOSIS — R0789 Other chest pain: Secondary | ICD-10-CM | POA: Insufficient documentation

## 2017-07-06 LAB — CBC
HEMATOCRIT: 43.4 % (ref 39.0–52.0)
HEMOGLOBIN: 15.3 g/dL (ref 13.0–17.0)
MCH: 32 pg (ref 26.0–34.0)
MCHC: 35.3 g/dL (ref 30.0–36.0)
MCV: 90.8 fL (ref 78.0–100.0)
Platelets: 267 10*3/uL (ref 150–400)
RBC: 4.78 MIL/uL (ref 4.22–5.81)
RDW: 13.8 % (ref 11.5–15.5)
WBC: 12.4 10*3/uL — ABNORMAL HIGH (ref 4.0–10.5)

## 2017-07-06 LAB — BASIC METABOLIC PANEL
ANION GAP: 12 (ref 5–15)
BUN: 12 mg/dL (ref 6–20)
CHLORIDE: 104 mmol/L (ref 101–111)
CO2: 22 mmol/L (ref 22–32)
Calcium: 8.9 mg/dL (ref 8.9–10.3)
Creatinine, Ser: 0.96 mg/dL (ref 0.61–1.24)
GFR calc Af Amer: >60 mL/min (ref >60)
GLUCOSE: 92 mg/dL (ref 65–99)
POTASSIUM: 4 mmol/L (ref 3.5–5.1)
Sodium: 138 mmol/L (ref 135–145)

## 2017-07-06 LAB — I-STAT TROPONIN, ED
Troponin i, poc: 0 ng/mL (ref 0.00–0.08)
Troponin i, poc: 0 ng/mL (ref 0.00–0.08)

## 2017-07-06 NOTE — ED Notes (Signed)
Patient transported to X-ray 

## 2017-07-06 NOTE — ED Triage Notes (Addendum)
Report from [facility]>  Pt arrived to triage on EMS stretcher asleep.  EMS reports pt was picked up outside [employer].  Reports he was walking and started having non-radiating L sided CP.  EMS administered ASA 324mg  and NTG SL x 1 and pt went to sleep.  Pt responds to loud voice.  States he now has a headache and chest pain decreased from 6/10 to 3/10.

## 2017-07-06 NOTE — ED Provider Notes (Signed)
[facility]   CSN: [id number] Arrival date & time: 07/06/17  0345     History   Chief Complaint Chief Complaint  Patient presents with  . Chest Pain    HPI Dulcy FannyCalvin Milhorn is a 37 y.o. male.  HPI   37 year old male brought here via EMS for evaluation of chest pain.  Per triage note, patient was picked up outside [employer] with complaint of chest pain.  He was given 324 mg of aspirin as well as a subungual nitro by EMS.  Patient did report that it helps with his pain but did complain of a headache.  Patient spent most of the time sleeping so history was limited.  Patient did report developing acute onset of left-sided chest pain last night while walking.  States he was walking for approximately 10-15 minutes when the pain appears.  Pain is nonradiating, initially intense and now rated as minimal, 1 out of 10.  He did report some shortness of breath but no coughing or hemoptysis.  He denies having fever, nausea, vomiting, back pain, abdominal pain or arm pain.  He denies any injury.  He did admits to consuming alcohol last night and unable to give me a quantity.  He denies any homicidal or suicidal ideation he denies any recreational drug use.  No significant cardiac history.  No prior history of PE or DVT, no recent surgery, prolonged bed rest, unilateral leg swelling or calf pain, active cancer or hemoptysis.  History reviewed. No pertinent past medical history.  Patient Active Problem List   Diagnosis Date Noted  . Influenza   . Fever in adult 06/03/2016  . Headache 06/03/2016  . Acute back pain     Past Surgical History:  Procedure Laterality Date  . I&D EXTREMITY Left 08/20/2015   Procedure: IRRIGATION AND DEBRIDEMENT OF FIRST SECOND AND THIRD TOE FRACTURE;  Surgeon: Toni ArthursJohn Hewitt, MD;  Location: [facility] OR;  Service: Orthopedics;  Laterality: Left;       Home Medications    Prior to Admission medications   Not on File     Family History No family history on file.  Social History Social History   Tobacco Use  . Smoking status: Current Every Day Smoker    Packs/day: 2.00    Types: Cigars  . Smokeless tobacco: Never Used  Substance Use Topics  . Alcohol use: Yes    Comment: occ  . Drug use: No     Allergies   Shrimp [shellfish allergy]   Review of Systems Review of Systems  All other systems reviewed and are negative.    Physical Exam Updated Vital Signs BP 127/62 (BP Location: Right Arm)   Pulse 68   Temp 98.4 F (36.9 C) (Oral)   Resp 18   SpO2 96%   Physical Exam  Constitutional: He appears well-developed and well-nourished. No distress.  Nontoxic, sleeping, easily arousable  HENT:  Head: Normocephalic and atraumatic.  Eyes: Conjunctivae are normal.  Neck: Neck supple.  Cardiovascular: Normal rate, regular rhythm, intact distal pulses and normal pulses.  Pulmonary/Chest: Effort normal and breath sounds normal. He has no decreased breath sounds. He has no wheezes. He has no rhonchi. He has no rales.  Abdominal: Soft. There is no tenderness.  Musculoskeletal:       Right lower leg: He exhibits no edema.       Left lower leg: He exhibits no edema.  Neurological: He is alert.  Skin: No rash noted.  Psychiatric: He has a normal mood and affect.  Nursing note and vitals reviewed.    ED Treatments / Results  Labs (all labs ordered are listed, but only abnormal results are displayed) Labs Reviewed  CBC - Abnormal; Notable for the following components:      Result Value   WBC 12.4 (*)    All other components within normal limits  BASIC METABOLIC PANEL  I-STAT TROPONIN, ED  I-STAT TROPONIN, ED    EKG  EKG Interpretation  Date/Time:  Saturday July 06 2017 03:42:53 EST Ventricular Rate:  71 PR Interval:  168 QRS Duration: 86 QT Interval:  398 QTC Calculation: 432 R Axis:   95 Text Interpretation:  Normal sinus rhythm Rightward axis Borderline ECG When  compared with ECG of 01/30/2017, HEART RATE has increased Confirmed by Dione Booze ([id number]) on 07/06/2017 5:33:46 AM       Radiology Dg Chest 2 View  Result Date: 07/06/2017 CLINICAL DATA:  Non radiating LEFT chest pain. EXAM: CHEST  2 VIEW COMPARISON:  Chest radiograph June 03, 2016 FINDINGS: Cardiomediastinal silhouette is normal. No pleural effusions or focal consolidations. Trachea projects midline and there is no pneumothorax. Soft tissue planes and included osseous structures are non-suspicious. IMPRESSION: Negative. Electronically Signed   By: Awilda Metro M.D.   On: 07/06/2017 06:00    Procedures Procedures (including critical care time)  Medications Ordered in ED Medications - No data to display   Initial Impression / Assessment and Plan / ED Course  I have reviewed the triage vital signs and the nursing notes.  Pertinent labs & imaging results that were available during my care of the patient were reviewed by me and considered in my medical decision making (see chart for details).     BP 112/65   Pulse (!) 53   Temp 98.4 F (36.9 C) (Oral)   Resp 14   SpO2 96%  Mildly bradycardic, asymptomatic.  Final Clinical Impressions(s) / ED Diagnoses   Final diagnoses:  Atypical chest pain    ED Discharge Orders    None     6:37 AM Patient brought here for evaluation of chest pain.  He is mostly chest pain-free.  Pain sounds atypical for ACS.  Friend in the room mentioned they have been going through some stressful situation last night but declined to go into detail.  Patient denies SI or HI and denies any recent trauma.  Initial EKG, blood work, and chest x-ray unremarkable.  Mildly elevated WBC of 12.4, likely stress demargination.  Negative troponin.  Will obtain a delta troponin.  He is PERC negative, low suspicion for PE.  7:41 AM Normal serial troponin.  Patient ambulate without difficulty.  He feels comfortable going home.  I encourage patients with  follow-up with a primary care provider for further evaluation of his chest discomfort.  Strict return precautions discussed.       Fayrene Helper, PA-C 07/06/17 6045    Dione Booze, MD 07/06/17 2227

## 2017-08-04 ENCOUNTER — Encounter (HOSPITAL_COMMUNITY): Payer: Self-pay | Admitting: Emergency Medicine

## 2017-08-04 ENCOUNTER — Other Ambulatory Visit: Payer: Self-pay

## 2017-08-04 DIAGNOSIS — B349 Viral infection, unspecified: Secondary | ICD-10-CM | POA: Insufficient documentation

## 2017-08-04 DIAGNOSIS — F17210 Nicotine dependence, cigarettes, uncomplicated: Secondary | ICD-10-CM | POA: Insufficient documentation

## 2017-08-04 NOTE — ED Triage Notes (Signed)
Reports body aches, cough, congestion that started this morning.  Took tylenol flu at home last dose 2 hours pta.

## 2017-08-05 ENCOUNTER — Emergency Department (HOSPITAL_COMMUNITY)
Admission: EM | Admit: 2017-08-05 | Discharge: 2017-08-05 | Disposition: A | Discharge status: Home / Self Care | Payer: Self-pay | Attending: Emergency Medicine | Admitting: Emergency Medicine

## 2017-08-05 DIAGNOSIS — B349 Viral infection, unspecified: Secondary | ICD-10-CM

## 2017-08-05 MED ORDER — BENZONATATE 100 MG PO CAPS: 100.0000 mg | ORAL_CAPSULE | Freq: Three times a day (TID) | ORAL | 0 refills | Status: DC

## 2017-08-05 MED ORDER — KETOROLAC TROMETHAMINE 60 MG/2ML IM SOLN
60.0000 mg | Freq: Once | INTRAMUSCULAR | Status: AC
  Administered 2017-08-05: 60 mg via INTRAMUSCULAR
  Filled 2017-08-05: qty 2

## 2017-08-05 MED ORDER — LORATADINE 10 MG PO TABS
10.0000 mg | ORAL_TABLET | Freq: Once | ORAL | Status: AC
  Administered 2017-08-05: 10 mg via ORAL
  Filled 2017-08-05: qty 1

## 2017-08-05 MED ORDER — CETIRIZINE-PSEUDOEPHEDRINE ER 5-120 MG PO TB12: 1.0000 | ORAL_TABLET | Freq: Two times a day (BID) | ORAL | 0 refills | Status: DC

## 2017-08-05 MED ORDER — BENZONATATE 100 MG PO CAPS
200.0000 mg | ORAL_CAPSULE | Freq: Once | ORAL | Status: AC
  Administered 2017-08-05: 200 mg via ORAL
  Filled 2017-08-05: qty 2

## 2017-08-05 MED ORDER — IBUPROFEN 600 MG PO TABS: 600.0000 mg | ORAL_TABLET | Freq: Four times a day (QID) | ORAL | 0 refills | Status: DC | PRN

## 2017-08-05 NOTE — ED Notes (Signed)
Pt understood dc material. NAD noted. 

## 2017-08-05 NOTE — ED Provider Notes (Signed)
[facility]   CSN: [id number] Arrival date & time: 08/04/17  2325     History   Chief Complaint Chief Complaint  Patient presents with  . Generalized Body Aches  . upper resp symptoms    HPI Edward Schroeder is a 37 y.o. male.   37 year old male presents to the emergency department for evaluation of flulike illness.  He reports 3 days of body aches, cough, congestion.  He reports initial symptoms of cough.  His symptoms have been constant since onset.  He has been taking Tylenol Cold and flu with some mild improvement.  Last dose of this medication was 2 hours prior to arrival.  He does report positive sick contacts.  No known fevers, inability to swallow, drooling, nausea, vomiting, diarrhea.      History reviewed. No pertinent past medical history.  Patient Active Problem List   Diagnosis Date Noted  . Influenza   . Fever in adult 06/03/2016  . Headache 06/03/2016  . Acute back pain     Past Surgical History:  Procedure Laterality Date  . I&D EXTREMITY Left 08/20/2015   Procedure: IRRIGATION AND DEBRIDEMENT OF FIRST SECOND AND THIRD TOE FRACTURE;  Surgeon: Toni ArthursJohn Hewitt, MD;  Location: [facility] OR;  Service: Orthopedics;  Laterality: Left;       Home Medications    Prior to Admission medications   Medication Sig Start Date End Date Taking? Authorizing Provider  benzonatate (TESSALON) 100 MG capsule Take 1 capsule (100 mg total) by mouth every 8 (eight) hours. 08/05/17   Antony MaduraHumes, Wayne Brunker, PA-C  cetirizine-pseudoephedrine (ZYRTEC-D) 5-120 MG tablet Take 1 tablet by mouth 2 (two) times daily. 08/05/17   Antony MaduraHumes, Luan Urbani, PA-C  ibuprofen (ADVIL,MOTRIN) 600 MG tablet Take 1 tablet (600 mg total) by mouth every 6 (six) hours as needed for headache, mild pain or moderate pain. 08/05/17   Antony MaduraHumes, Nguyen Todorov, PA-C    Family History No family history on file.  Social History Social History   Tobacco Use  . Smoking status: Current Every Day  Smoker    Packs/day: 2.00    Types: Cigars  . Smokeless tobacco: Never Used  Substance Use Topics  . Alcohol use: Yes    Comment: occ  . Drug use: No     Allergies   Shrimp [shellfish allergy]   Review of Systems Review of Systems Ten systems reviewed and are negative for acute change, except as noted in the HPI.    Physical Exam Updated Vital Signs BP 139/69 (BP Location: Right Arm)   Pulse 64   Temp 98.6 F (37 C) (Oral)   Resp 18   Ht 5' 9.5" (1.765 m)   Wt 86.2 kg (190 lb)   SpO2 96%   BMI 27.66 kg/m   Physical Exam  Constitutional: He is oriented to person, place, and time. He appears well-developed and well-nourished. No distress.  Nontoxic appearing and in NAD  HENT:  Head: Normocephalic and atraumatic.  Nasal congestion and edema; nares patent. Tolerating secretions without difficulty.  Eyes: Conjunctivae and EOM are normal. No scleral icterus.  Neck: Normal range of motion.  No nuchal rigidity or meningismus   Cardiovascular: Normal rate, regular rhythm and intact distal pulses.  Pulmonary/Chest: Effort normal. No stridor. No respiratory distress. He has no wheezes. He has no rales.  Lungs CTAB  Musculoskeletal: Normal range of motion.  Neurological: He is alert and oriented to person, place, and time. He exhibits normal muscle tone. Coordination normal.  GCS 15. Moving all extremities.  Skin: Skin is warm and dry. No rash noted. He is not diaphoretic. No erythema. No pallor.  Psychiatric: He has a normal mood and affect. His behavior is normal.  Nursing note and vitals reviewed.    ED Treatments / Results  Labs (all labs ordered are listed, but only abnormal results are displayed) Labs Reviewed - No data to display  EKG  EKG Interpretation None       Radiology No results found.  Procedures Procedures (including critical care time)  Medications Ordered in ED Medications  benzonatate (TESSALON) capsule 200 mg (not administered)    loratadine (CLARITIN) tablet 10 mg (not administered)  ketorolac (TORADOL) injection 60 mg (not administered)     Initial Impression / Assessment and Plan / ED Course  I have reviewed the triage vital signs and the nursing notes.  Pertinent labs & imaging results that were available during my care of the patient were reviewed by me and considered in my medical decision making (see chart for details).     Patient complaining of symptoms of flu-like illness. Mild to moderate symptoms of clear/yellow nasal discharge/congestion and body aches with cough for less than 10 days. Patient is afebrile. No concern for acute bacterial rhinosinusitis; likely viral in nature. Patient discharged with symptomatic treatment. Recommended follow-up with primary care physician. Return precautions discussed and provided. Patient discharged in stable condition with no unaddressed concerns.   Final Clinical Impressions(s) / ED Diagnoses   Final diagnoses:  Viral illness    ED Discharge Orders        Ordered    benzonatate (TESSALON) 100 MG capsule  Every 8 hours     08/05/17 0553    ibuprofen (ADVIL,MOTRIN) 600 MG tablet  Every 6 hours PRN     08/05/17 0553    cetirizine-pseudoephedrine (ZYRTEC-D) 5-120 MG tablet  2 times daily     08/05/17 0553       Antony Madura, PA-C 08/05/17 0559    Geoffery Lyons, MD 08/05/17 [phone]

## 2017-12-03 IMAGING — CT CT HEAD W/O CM
3 of 4 series · 17 of 47 positions shown, 20 images · non-contrast
Comparison: None.

CLINICAL DATA: Headache with back pain and fever.

EXAM:
CT HEAD WITHOUT CONTRAST
TECHNIQUE: Contiguous axial images were obtained from the base of the skull
through the vertex without intravenous contrast.

[Series 201: head w/o, idose (1) · axial · non-contrast · 0.45mm/px · z∈[+36,+166]mm · 11 of 32 slices shown, 14 images]
[im 3/32  brain]
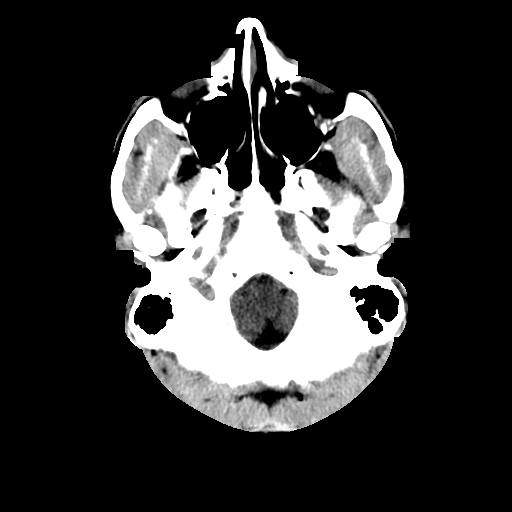
[im 3/32  bone]
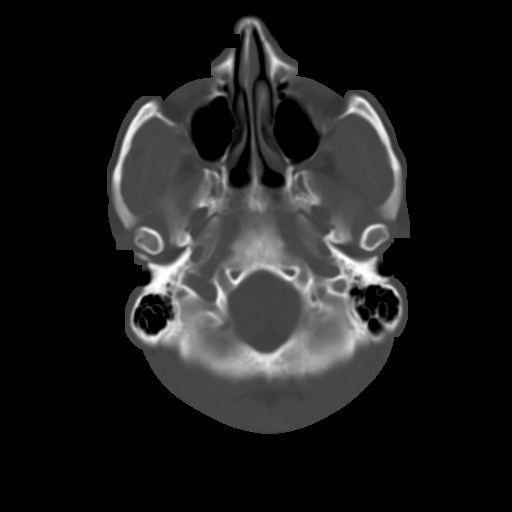
[im 5/32  brain]
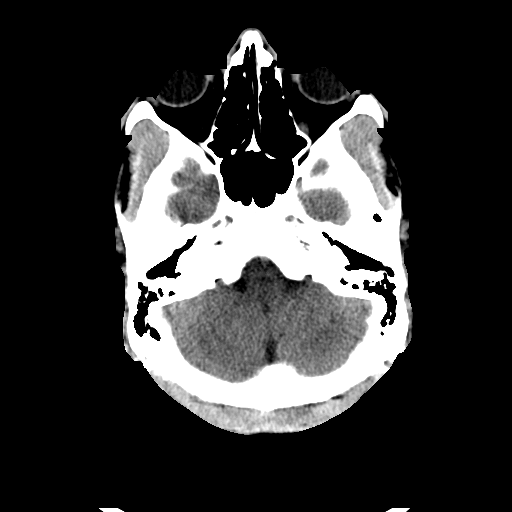
[im 7/32  brain]
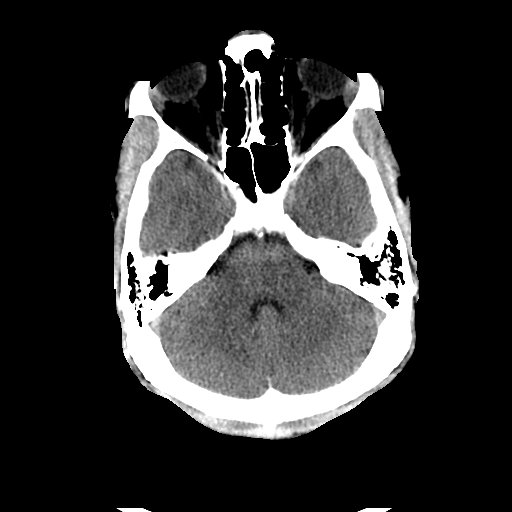
[im 12/32  brain]
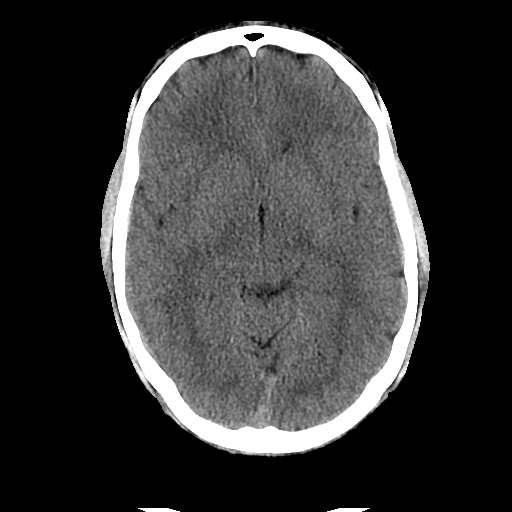
[im 14/32  brain]
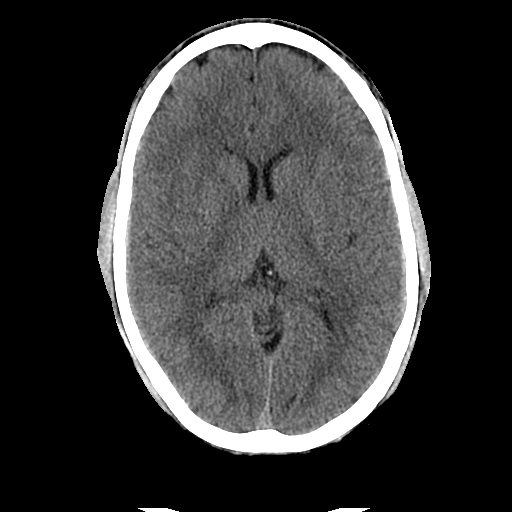
[im 14/32  bone]
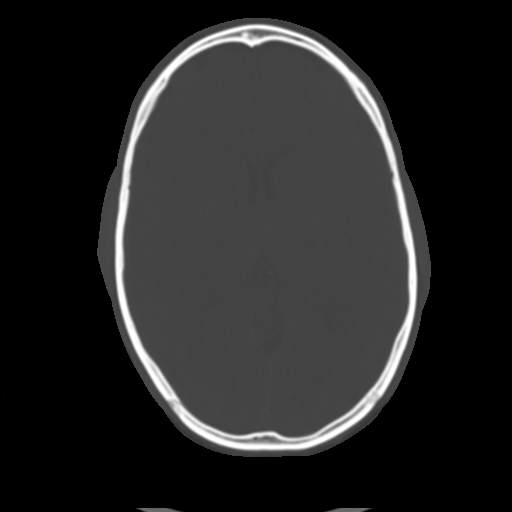
[im 16/32  brain]
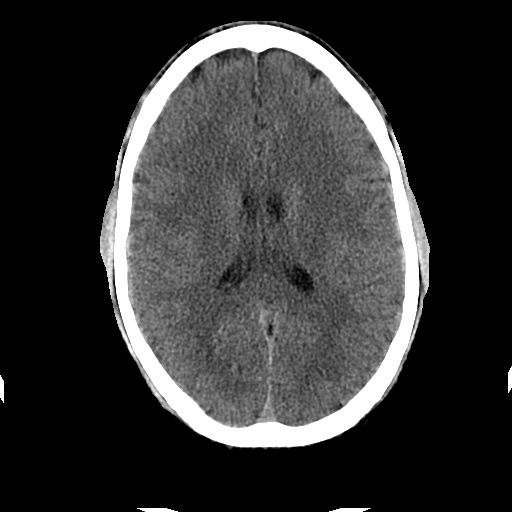
[im 18/32  brain]
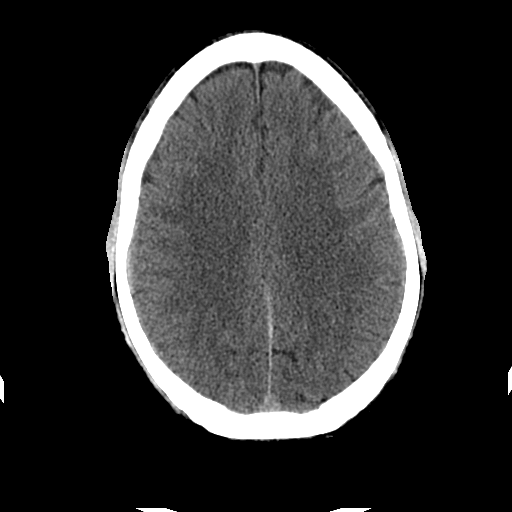
[im 20/32  brain]
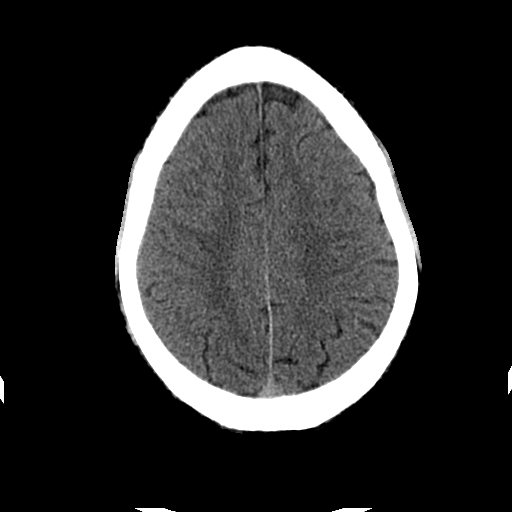
[im 25/32  brain]
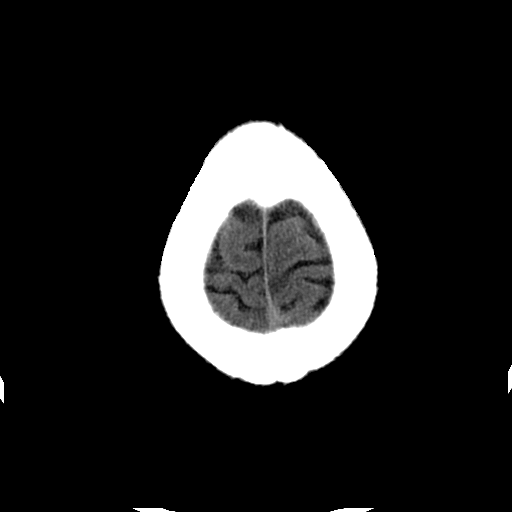
[im 25/32  bone]
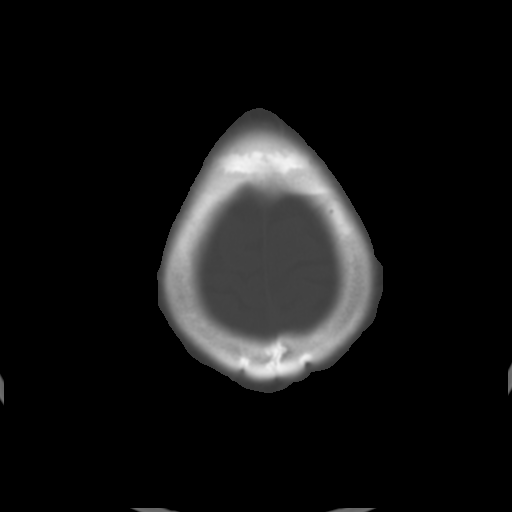
[im 27/32  brain]
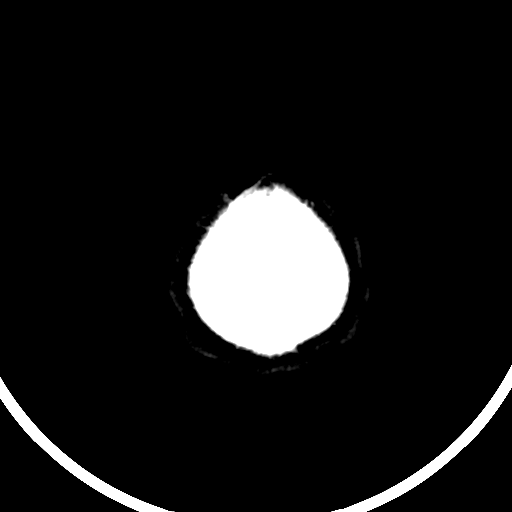
[im 29/32  brain]
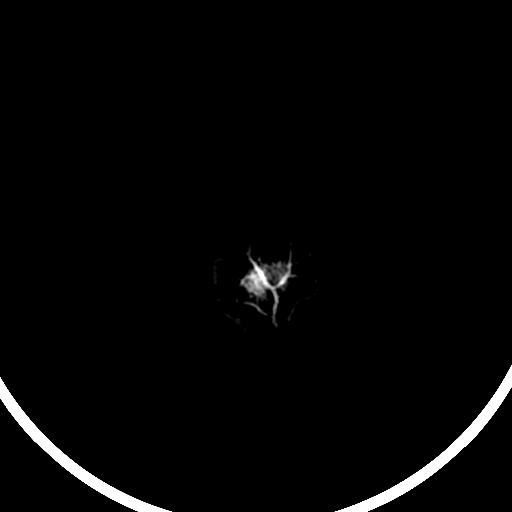

[Series 203: coronal st, idose (1) · coronal · 0.40mm/px · 3 of 75 slices shown]
[im 25/75  brain]
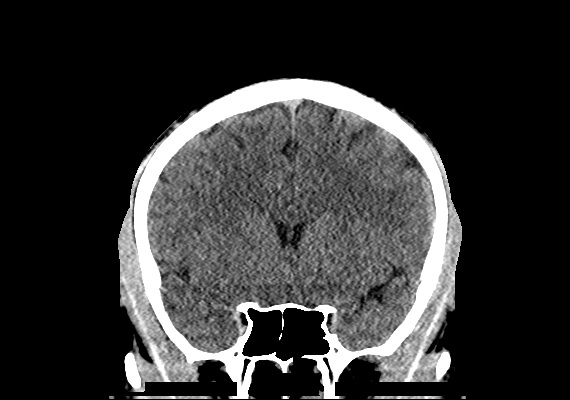
[im 33/75  brain]
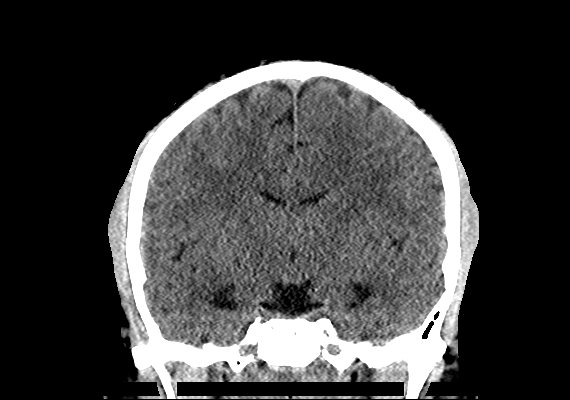
[im 42/75  brain]
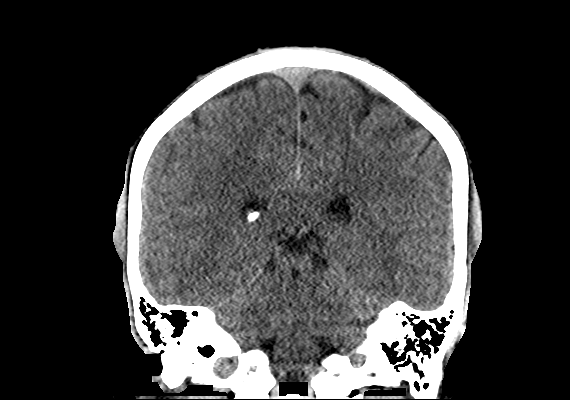

[Series 204: sagittal st, idose (1) · sagittal · 0.40mm/px · 3 of 76 slices shown]
[im 26/76  brain]
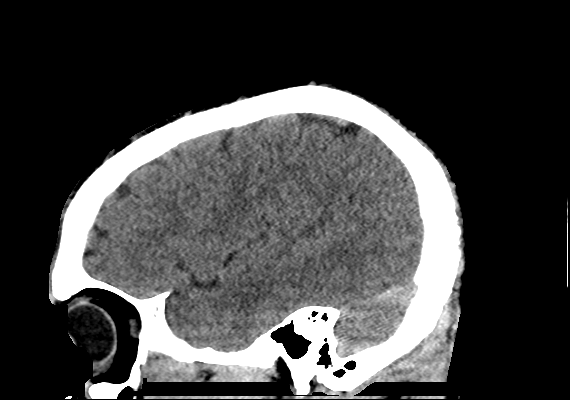
[im 38/76  brain]
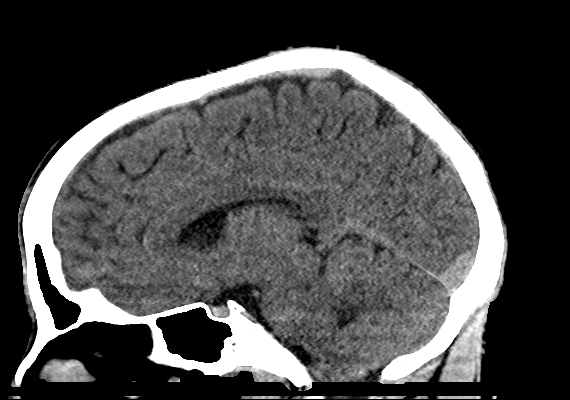
[im 51/76  brain]
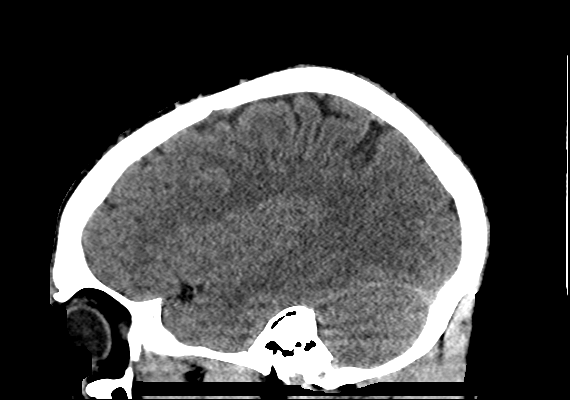

[17 of 47 positions shown; findings below may reference images not displayed]

FINDINGS: Brain: No mass lesion, hemorrhage, hydrocephalus, acute infarct,
intra-axial, or extra-axial fluid collection.

Vascular: No hyperdense vessel or unexpected calcification.

Skull: Normal

Sinuses/Orbits: Normal orbits and globes. Clear paranasal sinuses
and mastoid air cells.

Other: None
IMPRESSION: Normal head CT.

## 2017-12-17 ENCOUNTER — Emergency Department (HOSPITAL_BASED_OUTPATIENT_CLINIC_OR_DEPARTMENT_OTHER): Payer: Self-pay

## 2017-12-17 ENCOUNTER — Emergency Department (HOSPITAL_BASED_OUTPATIENT_CLINIC_OR_DEPARTMENT_OTHER)
Admission: EM | Admit: 2017-12-17 | Discharge: 2017-12-17 | Disposition: A | Discharge status: Home / Self Care | Payer: Self-pay | Attending: Emergency Medicine | Admitting: Emergency Medicine

## 2017-12-17 ENCOUNTER — Encounter (HOSPITAL_BASED_OUTPATIENT_CLINIC_OR_DEPARTMENT_OTHER): Payer: Self-pay | Admitting: Emergency Medicine

## 2017-12-17 ENCOUNTER — Other Ambulatory Visit: Payer: Self-pay

## 2017-12-17 DIAGNOSIS — F17290 Nicotine dependence, other tobacco product, uncomplicated: Secondary | ICD-10-CM | POA: Insufficient documentation

## 2017-12-17 DIAGNOSIS — R519 Headache, unspecified: Secondary | ICD-10-CM

## 2017-12-17 DIAGNOSIS — R51 Headache: Secondary | ICD-10-CM | POA: Insufficient documentation

## 2017-12-17 MED ORDER — SODIUM CHLORIDE 0.9 % IV BOLUS
1000.0000 mL | Freq: Once | INTRAVENOUS | Status: AC
  Administered 2017-12-17: 1000 mL via INTRAVENOUS

## 2017-12-17 MED ORDER — DIPHENHYDRAMINE HCL 50 MG/ML IJ SOLN
25.0000 mg | Freq: Once | INTRAMUSCULAR | Status: AC
  Administered 2017-12-17: 25 mg via INTRAVENOUS
  Filled 2017-12-17: qty 1

## 2017-12-17 MED ORDER — PROCHLORPERAZINE EDISYLATE 10 MG/2ML IJ SOLN
10.0000 mg | Freq: Once | INTRAMUSCULAR | Status: AC
  Administered 2017-12-17: 10 mg via INTRAVENOUS
  Filled 2017-12-17: qty 2

## 2017-12-17 MED ORDER — KETOROLAC TROMETHAMINE 15 MG/ML IJ SOLN
15.0000 mg | Freq: Once | INTRAMUSCULAR | Status: AC
  Administered 2017-12-17: 15 mg via INTRAVENOUS
  Filled 2017-12-17: qty 1

## 2017-12-17 NOTE — ED Provider Notes (Signed)
[facility] Provider Note: Lowella Dell, MD, FACEP  CSN: [id number] MRN: [id number] ARRIVAL: 12/17/17 at 0051 ROOM: [facility]   CHIEF COMPLAINT  Headache   HISTORY OF PRESENT ILLNESS  12/17/17 2:39 AM Edward Schroeder is a 37 y.o. male with a headache that started about 7:30 PM yesterday evening.  The onset was gradual.  He describes the headache is in involving his entire head and he describes the pain is severe.  It is throbbing in nature.  He has taken Aleve without relief.  There is photophobia but no nausea, vomiting, numbness or weakness.   History reviewed. No pertinent past medical history.  Past Surgical History:  Procedure Laterality Date  . I&D EXTREMITY Left 08/20/2015   Procedure: IRRIGATION AND DEBRIDEMENT OF FIRST SECOND AND THIRD TOE FRACTURE;  Surgeon: Toni Arthurs, MD;  Location: [facility] OR;  Service: Orthopedics;  Laterality: Left;    No family history on file.  Social History   Tobacco Use  . Smoking status: Current Every Day Smoker    Packs/day: 2.00    Types: Cigars  . Smokeless tobacco: Never Used  Substance Use Topics  . Alcohol use: Yes    Comment: occ  . Drug use: No    Prior to Admission medications   Medication Sig Start Date End Date Taking? Authorizing Provider  benzonatate (TESSALON) 100 MG capsule Take 1 capsule (100 mg total) by mouth every 8 (eight) hours. 08/05/17   Antony Madura, PA-C  cetirizine-pseudoephedrine (ZYRTEC-D) 5-120 MG tablet Take 1 tablet by mouth 2 (two) times daily. 08/05/17   Antony Madura, PA-C  ibuprofen (ADVIL,MOTRIN) 600 MG tablet Take 1 tablet (600 mg total) by mouth every 6 (six) hours as needed for headache, mild pain or moderate pain. 08/05/17   Antony Madura, PA-C    Allergies Shrimp [shellfish allergy]   REVIEW OF SYSTEMS  Negative except as noted here or in the History of Present Illness.   PHYSICAL EXAMINATION  Initial Vital Signs Blood pressure 132/76, pulse (!) 56, temperature 98.1 F (36.7 C),  temperature source Oral, height 5\' 9"  (1.753 m), weight 86.2 kg (190 lb), SpO2 96 %.  Examination General: Well-developed, well-nourished male in no acute distress; appearance consistent with age of record HENT: normocephalic; atraumatic Eyes: pupils equal, round and reactive to light; extraocular muscles intact; photophobia Neck: supple Heart: regular rate and rhythm Lungs: clear to auscultation bilaterally Abdomen: soft; nondistended; nontender; bowel sounds present Extremities: No deformity; full range of motion; pulses normal Neurologic: Awake, alert and oriented; motor function intact in all extremities and symmetric; no facial droop Skin: Warm and dry Psychiatric: Flat affect   RESULTS  Summary of this visit's results, reviewed by myself:   EKG Interpretation  Date/Time:    Ventricular Rate:    PR Interval:    QRS Duration:   QT Interval:    QTC Calculation:   R Axis:     Text Interpretation:        Laboratory Studies: No results found for this or any previous visit (from the past 24 hour(s)). Imaging Studies: Ct Head Wo Contrast  Result Date: 12/17/2017 CLINICAL DATA:  Worst headache of life for 7 hours.  Photophobia. EXAM: CT HEAD WITHOUT CONTRAST TECHNIQUE: Contiguous axial images were obtained from the base of the skull through the vertex without intravenous contrast. COMPARISON:  CT HEAD June 03, 2016 FINDINGS: BRAIN: No intraparenchymal hemorrhage, mass effect nor midline shift. The ventricles and sulci are normal. No acute large vascular territory infarcts. No  abnormal extra-axial fluid collections. Basal cisterns are patent. VASCULAR: Unremarkable. SKULL/SOFT TISSUES: No skull fracture. No significant soft tissue swelling. ORBITS/SINUSES: The included ocular globes and orbital contents are normal. Mildly prominent superior ophthalmic veins seen with Valsalva.The mastoid aircells and included paranasal sinuses are well-aerated. OTHER: None. IMPRESSION: Normal  noncontrast CT HEAD. Electronically Signed   By: Awilda Metroourtnay  Bloomer M.D.   On: 12/17/2017 03:23    ED COURSE and MDM  Nursing notes and initial vitals signs, including pulse oximetry, reviewed.  Vitals:   12/17/17 0415 12/17/17 0430 12/17/17 0500 12/17/17 0530  BP: 116/77 112/69 114/71 110/64  Pulse: 63 (!) 51 (!) 49 (!) 57  Resp:      Temp:      TempSrc:      SpO2: 95% 94% 95% 95%  Weight:      Height:       5:56 AM He is still somnolent but arousable after IV meds for headache.  Headache has improved significantly.  PROCEDURES    ED DIAGNOSES     ICD-10-CM   1. Bad headache R51        Latreece Mochizuki, MD 12/17/17 [phone]

## 2017-12-17 NOTE — ED Triage Notes (Signed)
C/o "whole head" headache that started around 1930, states he took Aleve at home without any relief. No hx of migraines. Does report light sensitivity, denies other sx.

## 2018-01-22 ENCOUNTER — Other Ambulatory Visit: Payer: Self-pay

## 2018-01-22 ENCOUNTER — Encounter (HOSPITAL_BASED_OUTPATIENT_CLINIC_OR_DEPARTMENT_OTHER): Payer: Self-pay | Admitting: Emergency Medicine

## 2018-01-22 ENCOUNTER — Emergency Department (HOSPITAL_BASED_OUTPATIENT_CLINIC_OR_DEPARTMENT_OTHER)
Admission: EM | Admit: 2018-01-22 | Discharge: 2018-01-22 | Disposition: A | Discharge status: Home / Self Care | Payer: Self-pay | Attending: Emergency Medicine | Admitting: Emergency Medicine

## 2018-01-22 DIAGNOSIS — M62830 Muscle spasm of back: Secondary | ICD-10-CM | POA: Insufficient documentation

## 2018-01-22 DIAGNOSIS — F17290 Nicotine dependence, other tobacco product, uncomplicated: Secondary | ICD-10-CM | POA: Insufficient documentation

## 2018-01-22 MED ORDER — METHOCARBAMOL 500 MG PO TABS: 500.0000 mg | ORAL_TABLET | Freq: Two times a day (BID) | ORAL | 0 refills | Status: DC

## 2018-01-22 MED ORDER — NAPROXEN 250 MG PO TABS
500.0000 mg | ORAL_TABLET | Freq: Once | ORAL | Status: AC
  Administered 2018-01-22: 500 mg via ORAL
  Filled 2018-01-22: qty 2

## 2018-01-22 MED ORDER — METHOCARBAMOL 500 MG PO TABS
1000.0000 mg | ORAL_TABLET | Freq: Once | ORAL | Status: AC
  Administered 2018-01-22: 1000 mg via ORAL
  Filled 2018-01-22: qty 2

## 2018-01-22 MED ORDER — LIDOCAINE 5 % EX PTCH: 1.0000 | MEDICATED_PATCH | CUTANEOUS | 0 refills | Status: DC

## 2018-01-22 MED ORDER — NAPROXEN 375 MG PO TABS: 375.0000 mg | ORAL_TABLET | Freq: Two times a day (BID) | ORAL | 0 refills | Status: DC

## 2018-01-22 NOTE — ED Provider Notes (Signed)
[facility] EMERGENCY DEPARTMENT Provider Note   CSN: [id number] Arrival date & time: 01/22/18  8264     History   Chief Complaint Chief Complaint  Patient presents with  . Back Pain    HPI Edward Schroeder is a 37 y.o. male.  The history is provided by the patient.  Back Pain   This is a recurrent problem. The current episode started more than 2 days ago. The problem occurs constantly. The problem has not changed since onset.The pain is associated with no known injury. The pain is present in the lumbar spine and sacro-iliac joint. The quality of the pain is described as stabbing. The pain does not radiate. The pain is severe. The symptoms are aggravated by bending, twisting and certain positions. Pertinent negatives include no chest pain, no fever, no numbness, no weight loss, no headaches, no abdominal pain, no abdominal swelling, no bowel incontinence, no perianal numbness, no bladder incontinence, no dysuria, no pelvic pain, no leg pain, no paresthesias, no paresis, no tingling and no weakness. Treatments tried: tylenol. The treatment provided no relief. Risk factors: none.  Patient with a h/o lumbar strain presents with recurrent LBP.  No f/c/r.  No weakness nor numbness, no changes in bowel or bladder habits.  Denies trauma.  No CP no SOB.    History reviewed. No pertinent past medical history.  Patient Active Problem List   Diagnosis Date Noted  . Influenza   . Fever in adult 06/03/2016  . Headache 06/03/2016  . Acute back pain     Past Surgical History:  Procedure Laterality Date  . I&D EXTREMITY Left 08/20/2015   Procedure: IRRIGATION AND DEBRIDEMENT OF FIRST SECOND AND THIRD TOE FRACTURE;  Surgeon: Toni Arthurs, MD;  Location: [facility] OR;  Service: Orthopedics;  Laterality: Left;        Home Medications    Prior to Admission medications   Medication Sig Start Date End Date Taking? Authorizing Provider  acetaminophen (TYLENOL) 500 MG tablet Take 1,000 mg by mouth  every 6 (six) hours as needed.   Yes [provider]    Family History No family history on file.  Social History Social History   Tobacco Use  . Smoking status: Current Every Day Smoker    Packs/day: 2.00    Types: Cigars  . Smokeless tobacco: Never Used  Substance Use Topics  . Alcohol use: Yes    Comment: occ  . Drug use: No     Allergies   Shrimp [shellfish allergy]   Review of Systems Review of Systems  Constitutional: Negative for appetite change, chills, fever and weight loss.  Respiratory: Negative for cough, chest tightness and shortness of breath.   Cardiovascular: Negative for chest pain, palpitations and leg swelling.  Gastrointestinal: Negative for abdominal pain and bowel incontinence.  Genitourinary: Negative for bladder incontinence, dysuria and pelvic pain.  Musculoskeletal: Positive for back pain.  Skin: Negative for color change, pallor, rash and wound.  Neurological: Negative for dizziness, tingling, weakness, numbness, headaches and paresthesias.  All other systems reviewed and are negative.    Physical Exam Updated Vital Signs BP 115/74 (BP Location: Left Arm)   Pulse (!) 44   Temp 97.8 F (36.6 C) (Oral)   Resp 16   Ht 5\' 9"  (1.753 m)   Wt 86.2 kg   SpO2 99%   BMI 28.06 kg/m   Physical Exam  Constitutional: He is oriented to person, place, and time. He appears well-developed and well-nourished. No distress.  HENT:  Head: Normocephalic and atraumatic.  Mouth/Throat: No oropharyngeal exudate.  Eyes: Pupils are equal, round, and reactive to light. Conjunctivae and EOM are normal.  Neck: Normal range of motion. Neck supple.  Cardiovascular: Normal rate, regular rhythm, normal heart sounds and intact distal pulses.  No murmur heard. Pulmonary/Chest: Effort normal and breath sounds normal. No stridor. No respiratory distress. He has no wheezes. He has no rales.  Abdominal: Soft. Bowel sounds are normal. He exhibits no mass. There  is no tenderness. There is no rebound and no guarding.  Musculoskeletal: Normal range of motion.       Cervical back: Normal.       Thoracic back: Normal.       Lumbar back: Normal.       Back:  Neurological: He is alert and oriented to person, place, and time.  Skin: Skin is warm and dry.  No splinter hemorrhages no osler nodes no Janeway lesions  Nursing note and vitals reviewed.    ED Treatments / Results   Procedures Procedures (including critical care time)  Medications Ordered in ED Medications  naproxen (NAPROSYN) tablet 500 mg (has no administration in time range)  methocarbamol (ROBAXIN) tablet 1,000 mg (has no administration in time range)       Final Clinical Impressions(s) / ED Diagnoses   Same as previous episode, no trauma, no red flags that would need imaging at this time.  Recent negative MRI.   Return for fevers >100.4 unrelieved by medication, shortness of breath, intractable vomiting, or diarrhea, Inability to tolerate liquids or food, cough, altered mental status or any concerns. No signs of systemic illness or infection. The patient is nontoxic-appearing on exam and vital signs are within normal limits.   I have reviewed the triage vital signs and the nursing notes. Pertinent labs &imaging results that were available during my care of the patient were reviewed by me and considered in my medical decision making (see chart for details).  After history, exam, and medical workup I feel the patient has been appropriately medically screened and is safe for discharge home. Pertinent diagnoses were discussed with the patient. Patient was given return precautions.   Demarius Archila, MD 01/22/18 [phone]

## 2018-01-22 NOTE — ED Triage Notes (Signed)
Pt reports awaking with mid-lower back pain. Denies known injury.

## 2018-02-05 ENCOUNTER — Emergency Department (HOSPITAL_BASED_OUTPATIENT_CLINIC_OR_DEPARTMENT_OTHER): Payer: Self-pay

## 2018-02-05 ENCOUNTER — Encounter (HOSPITAL_COMMUNITY): Payer: Self-pay

## 2018-02-05 ENCOUNTER — Encounter (HOSPITAL_BASED_OUTPATIENT_CLINIC_OR_DEPARTMENT_OTHER): Payer: Self-pay

## 2018-02-05 ENCOUNTER — Other Ambulatory Visit: Payer: Self-pay

## 2018-02-05 ENCOUNTER — Emergency Department (HOSPITAL_COMMUNITY): Payer: Self-pay

## 2018-02-05 ENCOUNTER — Emergency Department (HOSPITAL_COMMUNITY)
Admission: EM | Admit: 2018-02-05 | Discharge: 2018-02-05 | Disposition: A | Discharge status: Home / Self Care | Payer: Self-pay | Attending: Emergency Medicine | Admitting: Emergency Medicine

## 2018-02-05 DIAGNOSIS — F17210 Nicotine dependence, cigarettes, uncomplicated: Secondary | ICD-10-CM | POA: Insufficient documentation

## 2018-02-05 DIAGNOSIS — R0789 Other chest pain: Secondary | ICD-10-CM | POA: Insufficient documentation

## 2018-02-05 DIAGNOSIS — F17290 Nicotine dependence, other tobacco product, uncomplicated: Secondary | ICD-10-CM | POA: Insufficient documentation

## 2018-02-05 DIAGNOSIS — Z79899 Other long term (current) drug therapy: Secondary | ICD-10-CM | POA: Insufficient documentation

## 2018-02-05 LAB — CBC
HEMATOCRIT: 46.2 % (ref 39.0–52.0)
HEMATOCRIT: 44.9 % (ref 39.0–52.0)
HEMOGLOBIN: 15.4 g/dL (ref 13.0–17.0)
Hemoglobin: 15.9 g/dL (ref 13.0–17.0)
MCH: 30.4 pg (ref 26.0–34.0)
MCH: 31.1 pg (ref 26.0–34.0)
MCHC: 33.3 g/dL (ref 30.0–36.0)
MCHC: 35.4 g/dL (ref 30.0–36.0)
MCV: 91.1 fL (ref 78.0–100.0)
MCV: 87.7 fL (ref 78.0–100.0)
PLATELETS: 254 10*3/uL (ref 150–400)
Platelets: 263 10*3/uL (ref 150–400)
RBC: 5.07 MIL/uL (ref 4.22–5.81)
RBC: 5.12 MIL/uL (ref 4.22–5.81)
RDW: 13.6 % (ref 11.5–15.5)
RDW: 13.7 % (ref 11.5–15.5)
WBC: 9.1 10*3/uL (ref 4.0–10.5)
WBC: 8.5 10*3/uL (ref 4.0–10.5)

## 2018-02-05 LAB — BASIC METABOLIC PANEL
ANION GAP: 10 (ref 5–15)
Anion gap: 6 (ref 5–15)
BUN: 14 mg/dL (ref 6–20)
BUN: 12 mg/dL (ref 6–20)
CO2: 25 mmol/L (ref 22–32)
CO2: 27 mmol/L (ref 22–32)
CREATININE: 0.88 mg/dL (ref 0.61–1.24)
Calcium: 9.4 mg/dL (ref 8.9–10.3)
Calcium: 9.1 mg/dL (ref 8.9–10.3)
Chloride: 102 mmol/L (ref 98–111)
Chloride: 104 mmol/L (ref 98–111)
Creatinine, Ser: 1.06 mg/dL (ref 0.61–1.24)
GFR calc Af Amer: >60 mL/min (ref >60)
GFR calc Af Amer: >60 mL/min (ref >60)
GLUCOSE: 80 mg/dL (ref 70–99)
GLUCOSE: 99 mg/dL (ref 70–99)
POTASSIUM: 3.7 mmol/L (ref 3.5–5.1)
POTASSIUM: 3.6 mmol/L (ref 3.5–5.1)
SODIUM: 137 mmol/L (ref 135–145)
Sodium: 137 mmol/L (ref 135–145)

## 2018-02-05 LAB — I-STAT TROPONIN, ED: Troponin i, poc: 0 ng/mL (ref 0.00–0.08)

## 2018-02-05 LAB — TROPONIN I: Troponin I: <0.03 ng/mL (ref <0.03)

## 2018-02-05 NOTE — ED Triage Notes (Signed)
C/o PC x 1 month-NAD-steady gait

## 2018-02-05 NOTE — ED Triage Notes (Signed)
Pt states that for the past month he has been having L sided CP with no associating cardiac symptoms, denies heavy lifting.

## 2018-02-05 NOTE — ED Provider Notes (Signed)
[facility] EMERGENCY DEPARTMENT Provider Note  CSN: [id number] Arrival date & time: 02/05/18 0455  Chief Complaint(s) Chest Pain  HPI Edward Schroeder is a 37 y.o. male   The history is provided by the patient.  Chest Pain   This is a recurrent problem. Episode onset: over 1 month. Episode frequency: intermittent; daily. The problem has been resolved. The pain is associated with movement. Pain location: left pectoral. The pain is moderate. The quality of the pain is described as sharp. The pain does not radiate. Pertinent negatives include no back pain, no cough, no fever, no leg pain, no lower extremity edema, no nausea, no shortness of breath and no vomiting. Risk factors include male gender.  Pertinent negatives for past medical history include no CAD, no CHF, no diabetes, no DVT, no hyperlipidemia, no hypertension, no MI, no PE, no strokes and no TIA.    Past Medical History History reviewed. No pertinent past medical history. Patient Active Problem List   Diagnosis Date Noted  . Influenza   . Fever in adult 06/03/2016  . Headache 06/03/2016  . Acute back pain    Home Medication(s) Prior to Admission medications   Medication Sig Start Date End Date Taking? Authorizing Provider  acetaminophen (TYLENOL) 500 MG tablet Take 1,000 mg by mouth every 6 (six) hours as needed.    [provider]  lidocaine (LIDODERM) 5 % Place 1 patch onto the skin daily. Remove & Discard patch within 12 hours or as directed by MD 01/22/18   Nicanor Alcon, April, MD  methocarbamol (ROBAXIN) 500 MG tablet Take 1 tablet (500 mg total) by mouth 2 (two) times daily. 01/22/18   Palumbo, April, MD  naproxen (NAPROSYN) 375 MG tablet Take 1 tablet (375 mg total) by mouth 2 (two) times daily. 01/22/18   Palumbo, April, MD                                                                                                                                    Past Surgical History Past Surgical History:    Procedure Laterality Date  . I&D EXTREMITY Left 08/20/2015   Procedure: IRRIGATION AND DEBRIDEMENT OF FIRST SECOND AND THIRD TOE FRACTURE;  Surgeon: Toni Arthurs, MD;  Location: [facility] OR;  Service: Orthopedics;  Laterality: Left;   Family History No family history on file.  Social History Social History   Tobacco Use  . Smoking status: Current Every Day Smoker    Packs/day: 2.00    Types: Cigars  . Smokeless tobacco: Never Used  Substance Use Topics  . Alcohol use: Yes    Comment: occ  . Drug use: No   Allergies Shrimp [shellfish allergy]  Review of Systems Review of Systems  Constitutional: Negative for fever.  Respiratory: Negative for cough and shortness of breath.   Cardiovascular: Positive for chest pain.  Gastrointestinal: Negative for nausea and vomiting.  Musculoskeletal: Negative for back pain.  All other systems are reviewed and are negative for acute change except as noted in the HPI  Physical Exam Vital Signs  I have reviewed the triage vital signs BP 114/71   Pulse (!) 42   Temp 98.2 F (36.8 C) (Oral)   Resp 20   SpO2 98%   Physical Exam  Constitutional: He is oriented to person, place, and time. He appears well-developed and well-nourished. No distress.  HENT:  Head: Normocephalic and atraumatic.  Nose: Nose normal.  Eyes: Pupils are equal, round, and reactive to light. Conjunctivae and EOM are normal. Right eye exhibits no discharge. Left eye exhibits no discharge. No scleral icterus.  Neck: Normal range of motion. Neck supple.  Cardiovascular: Normal rate and regular rhythm. Exam reveals no gallop and no friction rub.  No murmur heard. Pulmonary/Chest: Effort normal and breath sounds normal. No stridor. No respiratory distress. He has no rales. He exhibits no tenderness.  Pain reproduced with activation of left pectoralis  Abdominal: Soft. He exhibits no distension. There is no tenderness.  Musculoskeletal: He exhibits no edema or tenderness.   Neurological: He is alert and oriented to person, place, and time.  Skin: Skin is warm and dry. No rash noted. He is not diaphoretic. No erythema.  Psychiatric: He has a normal mood and affect.  Vitals reviewed.   ED Results and Treatments Labs (all labs ordered are listed, but only abnormal results are displayed) Labs Reviewed  BASIC METABOLIC PANEL  CBC  I-STAT TROPONIN, ED                                                                                                                         EKG  EKG Interpretation  Date/Time:  Wednesday February 05 2018 04:59:37 EDT Ventricular Rate:  54 PR Interval:  176 QRS Duration: 92 QT Interval:  440 QTC Calculation: 417 R Axis:   89 Text Interpretation:  Sinus bradycardia Abnormal ECG No significant change since last tracing Reconfirmed by Drema Pry [phone]) on 02/05/2018 6:50:54 AM      Radiology Dg Chest 2 View  Result Date: 02/05/2018 CLINICAL DATA:  Initial evaluation for acute chest pain. EXAM: CHEST - 2 VIEW COMPARISON:  Prior radiograph from 07/06/2017. FINDINGS: The cardiac and mediastinal silhouettes are stable in size and contour, and remain within normal limits. The lungs are normally inflated. No airspace consolidation, pleural effusion, or pulmonary edema is identified. There is no pneumothorax. No acute osseous abnormality identified. IMPRESSION: No active cardiopulmonary disease. Electronically Signed   By: Rise Mu M.D.   On: 02/05/2018 05:44   Pertinent labs & imaging results that were available during my care of the patient were reviewed by me and considered in my medical decision making (see chart for details).  Medications Ordered in ED Medications - No data to display  Procedures Procedures  (including critical care time)  Medical Decision Making / ED Course I have  reviewed the nursing notes for this encounter and the patient's prior records (if available in EHR or on provided paperwork).    Chest pain most suspicious for muscular nature.  EKG without acute ischemic changes or evidence of pericarditis.  Troponin negative.  Doubt cardiac etiology.  Low pretest probability for pulmonary embolism.  PERC negative.  Not classic for aortic dissection or esophageal perforation.  Chest x-ray without evidence suggestive of pneumonia, pneumothorax, pneumomediastinum.  No abnormal contour of the mediastinum to suggest dissection. No evidence of acute injuries.  The patient appears reasonably screened and/or stabilized for discharge and I doubt any other medical condition or other [facility] requiring further screening, evaluation, or treatment in the ED at this time prior to discharge.  The patient is safe for discharge with strict return precautions.   Final Clinical Impression(s) / ED Diagnoses Final diagnoses:  Chest wall pain    Disposition: Discharge  Condition: Good  I have discussed the results, Dx and Tx plan with the patient who expressed understanding and agree(s) with the plan. Discharge instructions discussed at great length. The patient was given strict return precautions who verbalized understanding of the instructions. No further questions at time of discharge.    ED Discharge Orders    None       Follow Up: Primary care provider   If you do not have a primary care physician, contact [facility] at [phone] for referral     This chart was dictated using voice recognition software.  Despite best efforts to proofread,  errors can occur which can change the documentation meaning.   Nira Connardama, Pedro Eduardo, MD 02/05/18 [phone]

## 2018-02-05 NOTE — Discharge Instructions (Addendum)
You may use over-the-counter Motrin (Ibuprofen), Acetaminophen (Tylenol), topical muscle creams such as SalonPas, Icy Hot, Bengay, etc. Please stretch, apply heat, and have massage therapy for additional assistance. ° °

## 2018-02-06 ENCOUNTER — Emergency Department (HOSPITAL_BASED_OUTPATIENT_CLINIC_OR_DEPARTMENT_OTHER)
Admission: EM | Admit: 2018-02-06 | Discharge: 2018-02-06 | Disposition: A | Discharge status: Home / Self Care | Payer: Self-pay | Attending: Emergency Medicine | Admitting: Emergency Medicine

## 2018-02-06 DIAGNOSIS — R0789 Other chest pain: Secondary | ICD-10-CM

## 2018-02-06 MED ORDER — NAPROXEN 375 MG PO TABS: ORAL_TABLET | ORAL | 0 refills | Status: DC

## 2018-02-06 MED ORDER — KETOROLAC TROMETHAMINE 30 MG/ML IJ SOLN
30.0000 mg | Freq: Once | INTRAMUSCULAR | Status: AC
  Administered 2018-02-06: 30 mg via INTRAMUSCULAR

## 2018-02-06 MED ORDER — KETOROLAC TROMETHAMINE 30 MG/ML IJ SOLN
INTRAMUSCULAR | Status: AC
  Filled 2018-02-06: qty 1

## 2018-02-06 NOTE — ED Provider Notes (Signed)
[facility] Provider Note: Edward DellJ. Lane Edward Streater, MD, FACEP  CSN: [id number] MRN: [id number] ARRIVAL: 02/05/18 at 2205 ROOM: [facility]03/[facility]03   CHIEF COMPLAINT  Chest Pain   HISTORY OF PRESENT ILLNESS  02/06/18 12:31 AM Edward FannyCalvin Schroeder is a 37 y.o. male with a 1 month history of chest pain.  The pain is located in the left parasternal region.  It is both sharp and dull in nature.  It is worse with movement, palpation or activity.  He denies injury to that area.  There is no associated shortness of breath, diaphoresis, nausea, vomiting or cough.  He has been taking Aleve without relief.  He was seen yesterday for the same thing and had an unremarkable work-up.  He was given a diagnosis of chest wall pain.  His pain is minimal at rest but states it is "really bad" when he is active.  He denies leg pain or swelling.   History reviewed. No pertinent past medical history.  Past Surgical History:  Procedure Laterality Date  . I&D EXTREMITY Left 08/20/2015   Procedure: IRRIGATION AND DEBRIDEMENT OF FIRST SECOND AND THIRD TOE FRACTURE;  Surgeon: Toni ArthursJohn Hewitt, MD;  Location: [facility] OR;  Service: Orthopedics;  Laterality: Left;    No family history on file.  Social History   Tobacco Use  . Smoking status: Current Every Day Smoker    Packs/day: 2.00    Types: Cigars  . Smokeless tobacco: Never Used  Substance Use Topics  . Alcohol use: Yes    Comment: occ  . Drug use: Yes    Types: Marijuana    Prior to Admission medications   Medication Sig Start Date End Date Taking? Authorizing Provider  acetaminophen (TYLENOL) 500 MG tablet Take 1,000 mg by mouth every 6 (six) hours as needed.    [provider]  lidocaine (LIDODERM) 5 % Place 1 patch onto the skin daily. Remove & Discard patch within 12 hours or as directed by MD 01/22/18   Nicanor AlconPalumbo, April, MD  methocarbamol (ROBAXIN) 500 MG tablet Take 1 tablet (500 mg total) by mouth 2 (two) times daily. 01/22/18   Palumbo, April, MD  naproxen  (NAPROSYN) 375 MG tablet Take 1 tablet (375 mg total) by mouth 2 (two) times daily. 01/22/18   Palumbo, April, MD    Allergies Shrimp [shellfish allergy]   REVIEW OF SYSTEMS  Negative except as noted here or in the History of Present Illness.   PHYSICAL EXAMINATION  Initial Vital Signs Blood pressure 115/80, pulse (!) 52, temperature 98.1 F (36.7 C), temperature source Oral, resp. rate 18, height 5\' 9"  (1.753 m), weight 84.4 kg, SpO2 98 %.  Examination General: Well-developed, well-nourished male in no acute distress; appearance consistent with age of record HENT: normocephalic; atraumatic Eyes: pupils equal, round and reactive to light; extraocular muscles intact Neck: supple Heart: regular rate and rhythm; no murmur Lungs: clear to auscultation bilaterally Chest: Left parasternal tenderness that reproduces the pain of the chief complaint Abdomen: soft; nondistended; nontender; no masses or hepatosplenomegaly; bowel sounds present Extremities: No deformity; full range of motion; pulses normal; no edema Neurologic: Awake, alert and oriented; motor function intact in all extremities and symmetric; no facial droop Skin: Warm and dry Psychiatric: Normal mood and affect   RESULTS  Summary of this visit's results, reviewed by myself:   EKG Interpretation  Date/Time:    Ventricular Rate:    PR Interval:    QRS Duration:   QT Interval:    QTC Calculation:  R Axis:     Text Interpretation:        Laboratory Studies: Results for orders placed or performed during the hospital encounter of 02/06/18 (from the past 24 hour(s))  Basic metabolic panel     Status: None   Collection Time: 02/05/18 10:46 PM  Result Value Ref Range   Sodium 137 135 - 145 mmol/L   Potassium 3.6 3.5 - 5.1 mmol/L   Chloride 104 98 - 111 mmol/L   CO2 27 22 - 32 mmol/L   Glucose, Bld 99 70 - 99 mg/dL   BUN 12 6 - 20 mg/dL   Creatinine, Ser 1.61 0.61 - 1.24 mg/dL   Calcium 9.1 8.9 - 09.6 mg/dL    GFR calc non Af Amer >60 >60 mL/min   GFR calc Af Amer >60 >60 mL/min   Anion gap 6 5 - 15  CBC     Status: None   Collection Time: 02/05/18 10:46 PM  Result Value Ref Range   WBC 8.5 4.0 - 10.5 K/uL   RBC 5.12 4.22 - 5.81 MIL/uL   Hemoglobin 15.9 13.0 - 17.0 g/dL   HCT 04.5 40.9 - 81.1 %   MCV 87.7 78.0 - 100.0 fL   MCH 31.1 26.0 - 34.0 pg   MCHC 35.4 30.0 - 36.0 g/dL   RDW 91.4 78.2 - 95.6 %   Platelets 254 150 - 400 K/uL  Troponin I     Status: None   Collection Time: 02/05/18 10:46 PM  Result Value Ref Range   Troponin I <0.03 <0.03 ng/mL   Imaging Studies: Dg Chest 2 View  Result Date: 02/05/2018 CLINICAL DATA:  Chest pain EXAM: CHEST - 2 VIEW COMPARISON:  02/05/2018 FINDINGS: The heart size and mediastinal contours are within normal limits. Both lungs are clear. The visualized skeletal structures are unremarkable. IMPRESSION: No active cardiopulmonary disease. Electronically Signed   By: Jasmine Pang M.D.   On: 02/05/2018 23:34   Dg Chest 2 View  Result Date: 02/05/2018 CLINICAL DATA:  Initial evaluation for acute chest pain. EXAM: CHEST - 2 VIEW COMPARISON:  Prior radiograph from 07/06/2017. FINDINGS: The cardiac and mediastinal silhouettes are stable in size and contour, and remain within normal limits. The lungs are normally inflated. No airspace consolidation, pleural effusion, or pulmonary edema is identified. There is no pneumothorax. No acute osseous abnormality identified. IMPRESSION: No active cardiopulmonary disease. Electronically Signed   By: Rise Mu M.D.   On: 02/05/2018 05:44    ED COURSE and MDM  Nursing notes and initial vitals signs, including pulse oximetry, reviewed.  Vitals:   02/05/18 2217 02/06/18 0110  BP: 115/80 (!) 104/55  Pulse: (!) 52 (!) 48  Resp: 18 16  Temp: 98.1 F (36.7 C)   TempSrc: Oral   SpO2: 98% 98%  Weight: 84.4 kg   Height: 5\' 9"  (1.753 m)    The patient's history and examination are consistent with chest wall  pain, likely costochondritis.  His pain is reproducible and he has no other symptoms to suggest cardiac or pulmonary etiology.  We will treat him with NSAIDs.  PROCEDURES    ED DIAGNOSES     ICD-10-CM   1. Chest wall pain R07.89        Paula Libra, MD 02/06/18 [phone]

## 2018-02-11 ENCOUNTER — Emergency Department (HOSPITAL_COMMUNITY)
Admission: EM | Admit: 2018-02-11 | Discharge: 2018-02-12 | Disposition: A | Discharge status: Home / Self Care | Payer: Self-pay | Attending: Emergency Medicine | Admitting: Emergency Medicine

## 2018-02-11 ENCOUNTER — Other Ambulatory Visit: Payer: Self-pay

## 2018-02-11 ENCOUNTER — Encounter (HOSPITAL_COMMUNITY): Payer: Self-pay | Admitting: Emergency Medicine

## 2018-02-11 DIAGNOSIS — M62830 Muscle spasm of back: Secondary | ICD-10-CM | POA: Insufficient documentation

## 2018-02-11 DIAGNOSIS — F17290 Nicotine dependence, other tobacco product, uncomplicated: Secondary | ICD-10-CM | POA: Insufficient documentation

## 2018-02-11 DIAGNOSIS — F1210 Cannabis abuse, uncomplicated: Secondary | ICD-10-CM | POA: Insufficient documentation

## 2018-02-11 DIAGNOSIS — Z79899 Other long term (current) drug therapy: Secondary | ICD-10-CM | POA: Insufficient documentation

## 2018-02-11 DIAGNOSIS — X58XXXA Exposure to other specified factors, initial encounter: Secondary | ICD-10-CM | POA: Insufficient documentation

## 2018-02-11 DIAGNOSIS — Y939 Activity, unspecified: Secondary | ICD-10-CM | POA: Insufficient documentation

## 2018-02-11 DIAGNOSIS — T148XXA Other injury of unspecified body region, initial encounter: Secondary | ICD-10-CM | POA: Insufficient documentation

## 2018-02-11 DIAGNOSIS — Y929 Unspecified place or not applicable: Secondary | ICD-10-CM | POA: Insufficient documentation

## 2018-02-11 DIAGNOSIS — Y998 Other external cause status: Secondary | ICD-10-CM | POA: Insufficient documentation

## 2018-02-11 NOTE — ED Triage Notes (Signed)
Pt reports lower back pain that has gotten worse when he woke up today, specifically lower left. Pain increases with movement. Pt reports pain has been going on for a couple of months. Denies injuries, denies urinary sx. No neuro sx. Pt has been taking naproxen for pain without relief. Pt ambulatory in triage.

## 2018-02-12 MED ORDER — CYCLOBENZAPRINE HCL 10 MG PO TABS: 10.0000 mg | ORAL_TABLET | Freq: Every day | ORAL | 0 refills | Status: AC

## 2018-02-12 NOTE — ED Provider Notes (Signed)
[facility] EMERGENCY DEPARTMENT Provider Note  CSN: [id number] Arrival date & time: 02/11/18 2025  Chief Complaint(s) Back Pain  HPI Edward Schroeder is a 37 y.o. male who presents to the emergency department with several months of intermittent lower back aching pain that was exacerbated several days ago.  Pain exacerbated with range of motion and palpation.  Located bilateral lumbar sacral regions but worse on the left.  He denies any trauma.  No fevers or chills.  No urinary symptoms.  No bladder/bowel incontinence.  No lower extremity weakness or loss of sensation.  Has been taking naproxen with minimal relief.  HPI  Past Medical History History reviewed. No pertinent past medical history. Patient Active Problem List   Diagnosis Date Noted  . Influenza   . Fever in adult 06/03/2016  . Headache 06/03/2016  . Acute back pain    Home Medication(s) Prior to Admission medications   Medication Sig Start Date End Date Taking? Authorizing Provider  cyclobenzaprine (FLEXERIL) 10 MG tablet Take 1 tablet (10 mg total) by mouth at bedtime for 10 days. 02/12/18 02/22/18  Nira Conn, MD  lidocaine (LIDODERM) 5 % Place 1 patch onto the skin daily. Remove & Discard patch within 12 hours or as directed by MD 01/22/18   Nicanor Alcon, April, MD  methocarbamol (ROBAXIN) 500 MG tablet Take 1 tablet (500 mg total) by mouth 2 (two) times daily. 01/22/18   Palumbo, April, MD  naproxen (NAPROSYN) 375 MG tablet Take 1 tablet twice daily as needed for chest wall pain. 02/06/18   Molpus, John, MD                                                                                                                                    Past Surgical History Past Surgical History:  Procedure Laterality Date  . I&D EXTREMITY Left 08/20/2015   Procedure: IRRIGATION AND DEBRIDEMENT OF FIRST SECOND AND THIRD TOE FRACTURE;  Surgeon: Toni Arthurs, MD;  Location: [facility] OR;  Service: Orthopedics;  Laterality: Left;     Family History No family history on file.  Social History Social History   Tobacco Use  . Smoking status: Current Every Day Smoker    Packs/day: 2.00    Types: Cigars  . Smokeless tobacco: Never Used  Substance Use Topics  . Alcohol use: Yes    Comment: occ  . Drug use: Yes    Types: Marijuana   Allergies Shrimp [shellfish allergy]  Review of Systems Review of Systems As noted in HPI Physical Exam Vital Signs  I have reviewed the triage vital signs BP 129/89   Pulse (!) 51   Temp 98.1 F (36.7 C)   Resp 17   Ht 5\' 9"  (1.753 m)   Wt 84.4 kg   SpO2 94%   BMI 27.47 kg/m   Physical Exam  Constitutional: He is oriented to person, place, and time. He appears well-developed and  well-nourished. No distress.  HENT:  Head: Normocephalic and atraumatic.  Right Ear: External ear normal.  Left Ear: External ear normal.  Nose: Nose normal.  Mouth/Throat: Mucous membranes are normal. No trismus in the jaw.  Eyes: Conjunctivae and EOM are normal. No scleral icterus.  Neck: Normal range of motion and phonation normal.  Cardiovascular: Normal rate and regular rhythm.  Pulmonary/Chest: Effort normal. No stridor. No respiratory distress.  Abdominal: He exhibits no distension.  Musculoskeletal: Normal range of motion. He exhibits no edema.       Lumbar back: He exhibits tenderness and spasm. He exhibits no bony tenderness.       Back:  Neurological: He is alert and oriented to person, place, and time.  Spine Exam: Strength: 5/5 throughout LE bilaterally  Sensation: Intact to light touch in proximal and distal LE bilaterally    Skin: He is not diaphoretic.  Psychiatric: He has a normal mood and affect. His behavior is normal.  Vitals reviewed.   ED Results and Treatments Labs (all labs ordered are listed, but only abnormal results are displayed) Labs Reviewed - No data to display                                                                                                                        EKG  EKG Interpretation  Date/Time:    Ventricular Rate:    PR Interval:    QRS Duration:   QT Interval:    QTC Calculation:   R Axis:     Text Interpretation:        Radiology No results found. Pertinent labs & imaging results that were available during my care of the patient were reviewed by me and considered in my medical decision making (see chart for details).  Medications Ordered in ED Medications - No data to display                                                                                                                                  Procedures Procedures  (including critical care time)  Medical Decision Making / ED Course I have reviewed the nursing notes for this encounter and the patient's prior records (if available in EHR or on provided paperwork).    37 y.o. male presents with back pain in lumbar area for several days without signs of radicular pain. No acute traumatic onset. No red flag symptoms of fever,  weight loss, saddle anesthesia, weakness, fecal/urinary incontinence or urinary retention.   Suspect MSK etiology. No indication for imaging emergently. Patient was recommended to take short course of scheduled NSAIDs and engage in early mobility as definitive treatment. Return precautions discussed for worsening or new concerning symptoms.    The patient appears reasonably screened and/or stabilized for discharge and I doubt any other medical condition or other [facility] requiring further screening, evaluation, or treatment in the ED at this time prior to discharge.  The patient is safe for discharge with strict return precautions.   Final Clinical Impression(s) / ED Diagnoses Final diagnoses:  Muscle spasm of back  Muscle strain    Disposition: Discharge  Condition: Good  I have discussed the results, Dx and Tx plan with the patient who expressed understanding and agree(s) with the plan. Discharge instructions  discussed at great length. The patient was given strict return precautions who verbalized understanding of the instructions. No further questions at time of discharge.    ED Discharge Orders         Ordered    cyclobenzaprine (FLEXERIL) 10 MG tablet  Daily at bedtime     02/12/18 0040           Follow Up: Primary care provider   If you do not have a primary care physician, contact [facility] at [phone] for referral     This chart was dictated using voice recognition software.  Despite best efforts to proofread,  errors can occur which can change the documentation meaning.   Nira Conn, MD 02/12/18 0040

## 2018-02-12 NOTE — Discharge Instructions (Signed)
You may use over-the-counter Motrin (Ibuprofen), Acetaminophen (Tylenol), topical muscle creams such as SalonPas, Icy Hot, Bengay, etc. Please stretch, apply heat, and have massage therapy for additional assistance. ° °

## 2018-03-07 ENCOUNTER — Emergency Department (HOSPITAL_COMMUNITY)
Admission: EM | Admit: 2018-03-07 | Discharge: 2018-03-07 | Disposition: A | Discharge status: Home / Self Care | Payer: Self-pay | Attending: Emergency Medicine | Admitting: Emergency Medicine

## 2018-03-07 DIAGNOSIS — F17210 Nicotine dependence, cigarettes, uncomplicated: Secondary | ICD-10-CM | POA: Insufficient documentation

## 2018-03-07 DIAGNOSIS — Z79899 Other long term (current) drug therapy: Secondary | ICD-10-CM | POA: Insufficient documentation

## 2018-03-07 DIAGNOSIS — T782XXA Anaphylactic shock, unspecified, initial encounter: Secondary | ICD-10-CM | POA: Insufficient documentation

## 2018-03-07 MED ORDER — EPINEPHRINE 0.3 MG/0.3ML IJ SOAJ: 0.3000 mg | Freq: Once | INTRAMUSCULAR | 0 refills | Status: AC

## 2018-03-07 MED ORDER — PREDNISONE 20 MG PO TABS: 60.0000 mg | ORAL_TABLET | Freq: Every day | ORAL | 0 refills | Status: AC

## 2018-03-07 MED ORDER — METHYLPREDNISOLONE SODIUM SUCC 125 MG IJ SOLR
125.0000 mg | Freq: Once | INTRAMUSCULAR | Status: AC
  Administered 2018-03-07: 125 mg via INTRAVENOUS
  Filled 2018-03-07: qty 2

## 2018-03-07 MED ORDER — LACTATED RINGERS IV BOLUS
1000.0000 mL | Freq: Once | INTRAVENOUS | Status: AC
  Administered 2018-03-07: 1000 mL via INTRAVENOUS

## 2018-03-07 NOTE — ED Provider Notes (Signed)
[facility] EMERGENCY DEPARTMENT Provider Note   CSN: [id number] Arrival date & time: 03/07/18  1410     History   Chief Complaint Chief Complaint  Patient presents with  . Allergic Reaction    HPI Edward Schroeder is a 37 y.o. male.  The history is provided by the patient.  Allergic Reaction  Presenting symptoms: difficulty breathing, itching, rash and swelling   Severity:  Severe Prior allergic episodes:  Food/nut allergies Context: not animal exposure, not chemicals, not dairy/milk products, not eggs, not grass, not insect bite/sting, not medications, not new detergents/soaps and not nuts   Relieved by:  Antihistamines and epinephrine Worsened by:  Nothing   No past medical history on file.  Patient Active Problem List   Diagnosis Date Noted  . Influenza   . Fever in adult 06/03/2016  . Headache 06/03/2016  . Acute back pain     Past Surgical History:  Procedure Laterality Date  . I&D EXTREMITY Left 08/20/2015   Procedure: IRRIGATION AND DEBRIDEMENT OF FIRST SECOND AND THIRD TOE FRACTURE;  Surgeon: Toni Arthurs, MD;  Location: [facility] OR;  Service: Orthopedics;  Laterality: Left;        Home Medications    Prior to Admission medications   Medication Sig Start Date End Date Taking? Authorizing Provider  diphenhydrAMINE (BENADRYL) 25 mg capsule Take 25-50 mg by mouth as needed (for allergic reactions).    Yes [provider]  ibuprofen (ADVIL,MOTRIN) 200 MG tablet Take 200-800 mg by mouth every 6 (six) hours as needed (for headaches).    Yes [provider]  lidocaine (LIDODERM) 5 % Place 1 patch onto the skin daily. Remove & Discard patch within 12 hours or as directed by MD Patient taking differently: Place 1 patch onto the skin daily as needed (for pain). Remove & Discard patch within 12 hours or as directed by MD 01/22/18  Yes Palumbo, April, MD  methocarbamol (ROBAXIN) 500 MG tablet Take 1 tablet (500 mg total) by mouth 2 (two)  times daily. Patient taking differently: Take 500 mg by mouth 2 (two) times daily as needed for muscle spasms.  01/22/18  Yes Palumbo, April, MD  naproxen (NAPROSYN) 375 MG tablet Take 1 tablet twice daily as needed for chest wall pain. Patient taking differently: Take 375 mg by mouth 2 (two) times daily as needed (FOR CHEST WALL PAIN).  02/06/18  Yes Molpus, John, MD  EPINEPHrine 0.3 mg/0.3 mL IJ SOAJ injection Inject 0.3 mLs (0.3 mg total) into the muscle once for 1 dose. 03/07/18 03/07/18  Jakobi Thetford, DO  predniSONE (DELTASONE) 20 MG tablet Take 3 tablets (60 mg total) by mouth daily for 3 days. 03/07/18 03/10/18  Virgina Norfolk, DO    Family History No family history on file.  Social History Social History   Tobacco Use  . Smoking status: Current Every Day Smoker    Packs/day: 2.00    Types: Cigars  . Smokeless tobacco: Never Used  Substance Use Topics  . Alcohol use: Yes    Comment: occ  . Drug use: Yes    Types: Marijuana     Allergies   Shrimp [shellfish allergy]   Review of Systems Review of Systems  Constitutional: Negative for chills and fever.  HENT: Negative for ear pain and sore throat.   Eyes: Negative for pain and visual disturbance.  Respiratory: Positive for shortness of breath. Negative for cough.   Cardiovascular: Negative for chest pain and palpitations.  Gastrointestinal: Negative for abdominal  pain and vomiting.  Genitourinary: Negative for dysuria and hematuria.  Musculoskeletal: Negative for arthralgias and back pain.  Skin: Positive for itching and rash. Negative for color change.  Neurological: Negative for seizures and syncope.  All other systems reviewed and are negative.    Physical Exam Updated Vital Signs  ED Triage Vitals  Enc Vitals Group     BP 03/07/18 1410 116/60     Pulse Rate 03/07/18 1410 77     Resp 03/07/18 1410 18     Temp 03/07/18 1410 97.8 F (36.6 C)     Temp Source 03/07/18 1410 Oral     SpO2 03/07/18 1410 100 %       Weight --      Height --      Head Circumference --      Peak Flow --      Pain Score 03/07/18 1413 0     Pain Loc --      Pain Edu? --      Excl. in GC? --     Physical Exam  Constitutional: He appears well-developed and well-nourished. No distress.  HENT:  Head: Normocephalic and atraumatic.  Mouth/Throat: No oropharyngeal exudate.  No swelling of the tongue or lips  Eyes: Pupils are equal, round, and reactive to light. Conjunctivae and EOM are normal.  Neck: Normal range of motion. Neck supple.  Cardiovascular: Normal rate, regular rhythm, normal heart sounds and intact distal pulses.  No murmur heard. Pulmonary/Chest: Effort normal and breath sounds normal. No stridor. No respiratory distress. He has no wheezes.  Abdominal: Soft. Bowel sounds are normal. He exhibits no distension. There is no tenderness.  Musculoskeletal: Normal range of motion. He exhibits no edema.  Neurological: He is alert.  Easily arousable  Skin: Skin is warm and dry. Rash (swelling around eyes) noted.  Psychiatric: He has a normal mood and affect.  Nursing note and vitals reviewed.    ED Treatments / Results  Labs (all labs ordered are listed, but only abnormal results are displayed) Labs Reviewed - No data to display  EKG None  Radiology No results found.  Procedures Procedures (including critical care time)  Medications Ordered in ED Medications  lactated ringers bolus 1,000 mL (1,000 mLs Intravenous New Bag/Given 03/07/18 1536)  methylPREDNISolone sodium succinate (SOLU-MEDROL) 125 mg/2 mL injection 125 mg (125 mg Intravenous Given 03/07/18 1536)     Initial Impression / Assessment and Plan / ED Course  I have reviewed the triage vital signs and the nursing notes.  Pertinent labs & imaging results that were available during my care of the patient were reviewed by me and considered in my medical decision making (see chart for details).     Edward Schroeder is a 37 year old  male with no significant medical history who presents to the ED with allergic reaction.  Patient with possible anaphylaxis and was given IM epinephrine and IV Benadryl by EMS.  Patient has history of allergies to shellfish.  Wife states that he has never used epinephrine before.  Patient is somnolent after IV Benadryl.  He also worked overnight and went to the gym where this event occurred.  So he is very tired at baseline.  Patient with improvement of swelling around his eyes, lips, tongue.  There is no obvious signs of anaphylaxis at this time.  Clear breath sounds bilaterally.  No wheezing.  No abdominal symptoms.  No concern for further allergic reaction.  Overall appears improved.  Patient given lactated Ringer bolus,  IV Solu-Medrol.  He was observed in the ED for several hours without any issues.  He was educated about the use of EpiPen's.  Given prescription for prednisone.  Given information to follow-up with allergy and discharged from ED in good condition.  This chart was dictated using voice recognition software.  Despite best efforts to proofread,  errors can occur which can change the documentation meaning.   Final Clinical Impressions(s) / ED Diagnoses   Final diagnoses:  Anaphylaxis, initial encounter    ED Discharge Orders         Ordered    EPINEPHrine 0.3 mg/0.3 mL IJ SOAJ injection   Once     03/07/18 1635    predniSONE (DELTASONE) 20 MG tablet  Daily     03/07/18 1636           Virgina Norfolk, DO 03/07/18 1641

## 2018-03-07 NOTE — ED Triage Notes (Signed)
Pt arrives to ER for sudden onset generalized hives to body while playing basketball. Denies any new foods, detergents, lotions, etc. Patient received epi and IV benadryl prior to arrival. EMS reported initial angioedema which has since subsided with medication. Pt is alert on arrival, lethargic. VSS. Lungs clear. No oropharyngeal swelling noted.

## 2018-03-07 NOTE — ED Notes (Signed)
Patient verbalizes understanding of discharge instructions. Opportunity for questioning and answers were provided. Armband removed by staff, pt discharged from ED, ambulatory to lobby with significant other.  

## 2018-05-16 ENCOUNTER — Encounter (HOSPITAL_COMMUNITY): Payer: Self-pay

## 2018-05-16 ENCOUNTER — Other Ambulatory Visit: Payer: Self-pay

## 2018-05-16 ENCOUNTER — Emergency Department (HOSPITAL_COMMUNITY)
Admission: EM | Admit: 2018-05-16 | Discharge: 2018-05-16 | Disposition: A | Discharge status: Home / Self Care | Payer: Self-pay | Attending: Emergency Medicine | Admitting: Emergency Medicine

## 2018-05-16 DIAGNOSIS — B9789 Other viral agents as the cause of diseases classified elsewhere: Secondary | ICD-10-CM

## 2018-05-16 DIAGNOSIS — J069 Acute upper respiratory infection, unspecified: Secondary | ICD-10-CM | POA: Insufficient documentation

## 2018-05-16 DIAGNOSIS — F17290 Nicotine dependence, other tobacco product, uncomplicated: Secondary | ICD-10-CM | POA: Insufficient documentation

## 2018-05-16 MED ORDER — PROMETHAZINE-DM 6.25-15 MG/5ML PO SYRP: 5.0000 mL | ORAL_SOLUTION | Freq: Four times a day (QID) | ORAL | 0 refills | Status: DC | PRN

## 2018-05-16 MED ORDER — BENZONATATE 100 MG PO CAPS: 100.0000 mg | ORAL_CAPSULE | Freq: Three times a day (TID) | ORAL | 0 refills | Status: DC

## 2018-05-16 NOTE — ED Provider Notes (Signed)
[facility] Provider Note   CSN: [id number] Arrival date & time: 05/16/18  0118     History   Chief Complaint Chief Complaint  Patient presents with  . Nasal Congestion  . Cough    HPI Edward Schroeder is a 37 y.o. male with no chronic medical conditions who presents to the emergency department with a chief complaint of "I feel like trash."  He endorses nasal congestion and non-productive cough for the last 5 days with sneezing that started yesterday.  States he will have some soreness in his chest with cough, but denies chest tightness, chest pain, or dyspnea. He reports he has been treating his symptoms at home with over-the-counter cough medicine with minimal improvement.  He denies fever, chills, body aches, headache, nausea, vomiting, diarrhea, sore throat, otalgia, watery eyes, rhinorrhea, or rash.   No history of allergies or asthma.  He has not up-to-date on his flu shot.  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  He has been eating and drinking without difficulty.  No language interpreter was used.    History reviewed. No pertinent past medical history.  Patient Active Problem List   Diagnosis Date Noted  . Influenza   . Fever in adult 06/03/2016  . Headache 06/03/2016  . Acute back pain     Past Surgical History:  Procedure Laterality Date  . I&D EXTREMITY Left 08/20/2015   Procedure: IRRIGATION AND DEBRIDEMENT OF FIRST SECOND AND THIRD TOE FRACTURE;  Surgeon: Toni ArthursJohn Hewitt, MD;  Location: [facility] OR;  Service: Orthopedics;  Laterality: Left;        Home Medications    Prior to Admission medications   Medication Sig Start Date End Date Taking? Authorizing Provider  benzonatate (TESSALON) 100 MG capsule Take 1 capsule (100 mg total) by mouth every 8 (eight) hours. 05/16/18   Danell Verno A, PA-C  diphenhydrAMINE (BENADRYL) 25 mg capsule Take 25-50 mg by mouth as needed (for allergic reactions).      [provider]  ibuprofen (ADVIL,MOTRIN) 200 MG tablet Take 200-800 mg by mouth every 6 (six) hours as needed (for headaches).     [provider]  lidocaine (LIDODERM) 5 % Place 1 patch onto the skin daily. Remove & Discard patch within 12 hours or as directed by MD Patient taking differently: Place 1 patch onto the skin daily as needed (for pain). Remove & Discard patch within 12 hours or as directed by MD 01/22/18   Nicanor AlconPalumbo, April, MD  methocarbamol (ROBAXIN) 500 MG tablet Take 1 tablet (500 mg total) by mouth 2 (two) times daily. Patient taking differently: Take 500 mg by mouth 2 (two) times daily as needed for muscle spasms.  01/22/18   Palumbo, April, MD  naproxen (NAPROSYN) 375 MG tablet Take 1 tablet twice daily as needed for chest wall pain. Patient taking differently: Take 375 mg by mouth 2 (two) times daily as needed (FOR CHEST WALL PAIN).  02/06/18   Molpus, John, MD  promethazine-dextromethorphan (PROMETHAZINE-DM) 6.25-15 MG/5ML syrup Take 5 mLs by mouth 4 (four) times daily as needed for cough. 05/16/18   Negan Grudzien, Coral ElseMia A, PA-C    Family History History reviewed. No pertinent family history.  Social History Social History   Tobacco Use  . Smoking status: Current Every Day Smoker    Packs/day: 2.00    Types: Cigars  . Smokeless tobacco: Never Used  Substance Use Topics  . Alcohol use: Yes    Comment:  occ  . Drug use: Yes    Types: Marijuana     Allergies   Shrimp [shellfish allergy]   Review of Systems Review of Systems  Constitutional: Negative for appetite change, chills and fever.  HENT: Positive for congestion and sneezing. Negative for drooling, ear pain, postnasal drip, rhinorrhea, sinus pressure, sinus pain, sore throat and trouble swallowing.   Eyes: Negative for discharge and visual disturbance.  Respiratory: Positive for cough. Negative for shortness of breath and wheezing.   Cardiovascular: Negative for chest pain and palpitations.    Gastrointestinal: Negative for abdominal pain.  Genitourinary: Negative for dysuria.  Musculoskeletal: Negative for back pain.  Skin: Negative for rash.  Allergic/Immunologic: Negative for immunocompromised state.  Neurological: Negative for headaches.  Psychiatric/Behavioral: Negative for confusion.     Physical Exam Updated Vital Signs BP 116/72 (BP Location: Right Arm)   Pulse (!) 54   Temp 98.1 F (36.7 C) (Oral)   Resp 16   Ht 5\' 9"  (1.753 m)   Wt 88.5 kg   SpO2 99%   BMI 28.80 kg/m   Physical Exam Vitals signs and nursing note reviewed.  Constitutional:      Appearance: He is well-developed. He is not ill-appearing or diaphoretic.  HENT:     Head: Normocephalic.     Right Ear: Hearing and tympanic membrane normal.     Left Ear: Hearing and tympanic membrane normal.     Nose: Mucosal edema and congestion present.     Right Turbinates: Swollen. Not enlarged or pale.     Left Turbinates: Swollen. Not enlarged or pale.     Right Sinus: No maxillary sinus tenderness or frontal sinus tenderness.     Left Sinus: No maxillary sinus tenderness or frontal sinus tenderness.     Mouth/Throat:     Lips: Pink. No lesions.     Mouth: Mucous membranes are moist.     Tongue: No lesions.     Pharynx: Posterior oropharyngeal erythema present.     Tonsils: No tonsillar exudate or tonsillar abscesses.  Eyes:     Conjunctiva/sclera: Conjunctivae normal.  Neck:     Musculoskeletal: Neck supple.  Cardiovascular:     Rate and Rhythm: Normal rate and regular rhythm.     Heart sounds: No murmur. No friction rub. No gallop.   Pulmonary:     Effort: Pulmonary effort is normal. No respiratory distress.     Breath sounds: No stridor. No wheezing, rhonchi or rales.     Comments: Lungs are clear to auscultation bilaterally. Abdominal:     General: There is no distension.     Palpations: Abdomen is soft.  Skin:    General: Skin is warm and dry.  Neurological:     Mental Status: He is  alert.  Psychiatric:        Behavior: Behavior normal.      ED Treatments / Results  Labs (all labs ordered are listed, but only abnormal results are displayed) Labs Reviewed - No data to display  EKG None  Radiology No results found.  Procedures Procedures (including critical care time)  Medications Ordered in ED Medications - No data to display   Initial Impression / Assessment and Plan / ED Course  I have reviewed the triage vital signs and the nursing notes.  Pertinent labs & imaging results that were available during my care of the patient were reviewed by me and considered in my medical decision making (see chart for details).  37 year old male with no pertinent past medical history presenting with nasal congestion, nonproductive cough for 5 days.  Physical exam and vital signs are reassuring.  Patients symptoms are consistent with URI, likely viral etiology. Discussed that antibiotics are not indicated for viral infections. Pt will be discharged with symptomatic treatment.  Verbalizes understanding and is agreeable with plan. Pt is hemodynamically stable & in NAD prior to dc.  Final Clinical Impressions(s) / ED Diagnoses   Final diagnoses:  Viral URI with cough    ED Discharge Orders         Ordered    promethazine-dextromethorphan (PROMETHAZINE-DM) 6.25-15 MG/5ML syrup  4 times daily PRN     05/16/18 0622    benzonatate (TESSALON) 100 MG capsule  Every 8 hours     05/16/18 0622           Mazzie Brodrick, Pedro EarlsMia A, PA-C 05/16/18 [id number]    Dione BoozeGlick, David, MD 05/16/18 [phone]

## 2018-05-16 NOTE — Discharge Instructions (Signed)
Thank you for allowing me to care for you today in the Emergency Department.   Your symptoms today are consistent with a viral upper respiratory infection.  Most viral upper respiratory infections will resolve in 7 to 10 days, but if you have a cough it may last for the next 6 to 8 weeks.  This is normal.  You can take 5 mL's of promethazine dextromethorphan every 6 hours as needed for nasal congestion.  You can also take 1 tablet of benzonatate every 8 hours as needed for cough.  I have also attached instructions for a sinus rinse that may help with your congestion.  Take 650 mg of Tylenol or 600 mg of ibuprofen with food every 6 hours if you develop a headache, body aches, fever, or for pain.  You can call the number on your discharge paperwork to get established with a primary care provider for follow-up if your symptoms do not start to improve in the next 5 to 7 days.  Return to the emergency department if you develop significant shortness of breath, high fever, if you stop making urine, if you develop confusion, or other new, concerning symptoms.

## 2018-05-16 NOTE — ED Triage Notes (Signed)
Pt here with cough, sneezing, and congestions for a week.  Afebrile in triage.  Able to tolerate fluids.  A&Ox4.

## 2018-06-10 ENCOUNTER — Encounter (HOSPITAL_COMMUNITY): Payer: Self-pay

## 2018-06-10 ENCOUNTER — Ambulatory Visit (HOSPITAL_COMMUNITY)
Admission: EM | Admit: 2018-06-10 | Discharge: 2018-06-10 | Disposition: A | Discharge status: Home / Self Care | Payer: 59 | Attending: Family Medicine | Admitting: Family Medicine

## 2018-06-10 DIAGNOSIS — M62830 Muscle spasm of back: Secondary | ICD-10-CM | POA: Insufficient documentation

## 2018-06-10 MED ORDER — CYCLOBENZAPRINE HCL 5 MG PO TABS: 5.0000 mg | ORAL_TABLET | Freq: Two times a day (BID) | ORAL | 0 refills | Status: DC | PRN

## 2018-06-10 MED ORDER — IBUPROFEN 600 MG PO TABS: 600.0000 mg | ORAL_TABLET | Freq: Four times a day (QID) | ORAL | 0 refills | Status: DC | PRN

## 2018-06-10 MED ORDER — TRAMADOL HCL 50 MG PO TABS: 50.0000 mg | ORAL_TABLET | Freq: Four times a day (QID) | ORAL | 0 refills | Status: DC | PRN

## 2018-06-10 MED ORDER — KETOROLAC TROMETHAMINE 60 MG/2ML IM SOLN
INTRAMUSCULAR | Status: AC
  Filled 2018-06-10: qty 2

## 2018-06-10 MED ORDER — KETOROLAC TROMETHAMINE 60 MG/2ML IM SOLN
60.0000 mg | Freq: Once | INTRAMUSCULAR | Status: AC
  Administered 2018-06-10: 60 mg via INTRAMUSCULAR

## 2018-06-10 NOTE — ED Provider Notes (Signed)
[facility]    CSN: [id number] Arrival date & time: 06/10/18  1858     History   Chief Complaint Chief Complaint  Patient presents with  . Back Pain    HPI Edward Schroeder is a 38 y.o. male no significant past medical history presenting today for evaluation of back pain.  Patient states that he woke up this morning with back stiffness.  Throughout the day he has had multiple episodes of back spasming.  Notices on both sides in his lower and mid back.  Denies any numbness or tingling.  Denies saddle anesthesia.  Denies loss of bowel or bladder control.  He has had recurrent issues with this in the past.  He has tried [facility] powders today.  In the past he states that he has not had any improvement with muscle relaxers, Naprosyn or ibuprofen's.  He denies any injury or recent increase in activity or heavy lifting.  HPI  History reviewed. No pertinent past medical history.  Patient Active Problem List   Diagnosis Date Noted  . Influenza   . Fever in adult 06/03/2016  . Headache 06/03/2016  . Acute back pain     Past Surgical History:  Procedure Laterality Date  . I&D EXTREMITY Left 08/20/2015   Procedure: IRRIGATION AND DEBRIDEMENT OF FIRST SECOND AND THIRD TOE FRACTURE;  Surgeon: Toni Arthurs, MD;  [facility];  Service: Orthopedics;  Laterality: Left;       Home Medications    Prior to Admission medications   Medication Sig Start Date End Date Taking? Authorizing Provider  cyclobenzaprine (FLEXERIL) 5 MG tablet Take 1-2 tablets (5-10 mg total) by mouth 2 (two) times daily as needed for muscle spasms. 06/10/18   Telina Kleckley C, PA-C  diphenhydrAMINE (BENADRYL) 25 mg capsule Take 25-50 mg by mouth as needed (for allergic reactions).     [provider]  ibuprofen (ADVIL,MOTRIN) 600 MG tablet Take 1 tablet (600 mg total) by mouth every 6 (six) hours as needed. 06/10/18   Ahria Slappey C, PA-C  lidocaine (LIDODERM) 5 % Place 1 patch onto the skin daily.  Remove & Discard patch within 12 hours or as directed by MD Patient taking differently: Place 1 patch onto the skin daily as needed (for pain). Remove & Discard patch within 12 hours or as directed by MD 01/22/18   Nicanor Alcon, April, MD  methocarbamol (ROBAXIN) 500 MG tablet Take 1 tablet (500 mg total) by mouth 2 (two) times daily. Patient taking differently: Take 500 mg by mouth 2 (two) times daily as needed for muscle spasms.  01/22/18   Palumbo, April, MD  naproxen (NAPROSYN) 375 MG tablet Take 1 tablet twice daily as needed for chest wall pain. Patient taking differently: Take 375 mg by mouth 2 (two) times daily as needed (FOR CHEST WALL PAIN).  02/06/18   Molpus, Jonny Ruiz, MD  traMADol (ULTRAM) 50 MG tablet Take 1 tablet (50 mg total) by mouth every 6 (six) hours as needed for severe pain. 06/10/18   Elisah Parmer, Junius Creamer, PA-C    Family History Family History  Problem Relation Age of Onset  . Healthy Mother   . Healthy Father     Social History Social History   Tobacco Use  . Smoking status: Current Every Day Smoker    Packs/day: 2.00    Types: Cigars  . Smokeless tobacco: Never Used  Substance Use Topics  . Alcohol use: Yes    Comment: occ  . Drug use: Yes  Types: Marijuana     Allergies   Shrimp [shellfish allergy]   Review of Systems Review of Systems  Constitutional: Negative for fatigue and fever.  Eyes: Negative for redness, itching and visual disturbance.  Respiratory: Negative for shortness of breath.   Cardiovascular: Negative for chest pain and leg swelling.  Gastrointestinal: Negative for nausea and vomiting.  Genitourinary: Negative for decreased urine volume and difficulty urinating.  Musculoskeletal: Positive for back pain and myalgias. Negative for arthralgias.  Skin: Negative for color change, rash and wound.  Neurological: Negative for dizziness, syncope, weakness, light-headedness, numbness and headaches.     Physical Exam Triage Vital Signs ED Triage  Vitals  Enc Vitals Group     BP 06/10/18 1934 113/63     Pulse Rate 06/10/18 1934 60     Resp 06/10/18 1934 20     Temp 06/10/18 1934 99.7 F (37.6 C)     Temp Source 06/10/18 1934 Oral     SpO2 06/10/18 1934 98 %     Weight --      Height --      Head Circumference --      Peak Flow --      Pain Score 06/10/18 1933 8     Pain Loc --      Pain Edu? --      Excl. in GC? --    No data found.  Updated Vital Signs BP 113/63 (BP Location: Right Arm)   Pulse 60   Temp 99.7 F (37.6 C) (Oral)   Resp 20   SpO2 98%   Visual Acuity Right Eye Distance:   Left Eye Distance:   Bilateral Distance:    Right Eye Near:   Left Eye Near:    Bilateral Near:     Physical Exam Vitals signs and nursing note reviewed.  Constitutional:      Appearance: He is well-developed.  HENT:     Head: Normocephalic and atraumatic.  Eyes:     Conjunctiva/sclera: Conjunctivae normal.  Neck:     Musculoskeletal: Neck supple.  Cardiovascular:     Rate and Rhythm: Normal rate and regular rhythm.     Heart sounds: No murmur.  Pulmonary:     Effort: Pulmonary effort is normal. No respiratory distress.     Breath sounds: Normal breath sounds.  Abdominal:     Palpations: Abdomen is soft.     Tenderness: There is no abdominal tenderness.  Musculoskeletal:     Comments: Nontender to palpation of thoracic and lumbar spine midline.  Tenderness to palpation of bilateral lumbar musculature as well as lower thoracic musculature bilaterally.  Negative straight leg raise.  Strength 5/5 and equal bilaterally at hips and knees.  Patellar reflexes difficult to obtain bilaterally.  Skin:    General: Skin is warm and dry.  Neurological:     Mental Status: He is alert.      UC Treatments / Results  Labs (all labs ordered are listed, but only abnormal results are displayed) Labs Reviewed - No data to display  EKG None  Radiology No results found.  Procedures Procedures (including critical care  time)  Medications Ordered in UC Medications  ketorolac (TORADOL) injection 60 mg (has no administration in time range)    Initial Impression / Assessment and Plan / UC Course  I have reviewed the triage vital signs and the nursing notes.  Pertinent labs & imaging results that were available during my care of the patient were reviewed by me  and considered in my medical decision making (see chart for details).     Patient likely with muscular back pain.  Will provide Toradol in clinic prior to discharge.  Will still recommend treatment with anti-inflammatories and muscle relaxers as mainstay of treatment.  Advised against total bedrest, avoid heavy lifting.  Did provide patient with a few tablets of tramadol for severe pain.  No red flags for cauda equina.  No neuro deficits.  Discussed strict return precautions. Patient verbalized understanding and is agreeable with plan.  Final Clinical Impressions(s) / UC Diagnoses   Final diagnoses:  Muscle spasm of back     Discharge Instructions     We gave you a shot of Toradol today Use anti-inflammatories for pain/swelling. You may take up to 800 mg Ibuprofen every 8 hours with food. You may supplement Ibuprofen with Tylenol [phone] mg every 8 hours.  You may use flexeril as needed to help with pain. This is a muscle relaxer and causes sedation- please use only at bedtime or when you will be home and not have to drive/work- Begin with 1 tablet, may increase to 2 Tramadol only for severe pain- do not mix with flexeril, do not drive or work after taking   ED Prescriptions    Medication Sig Dispense Auth. Provider   cyclobenzaprine (FLEXERIL) 5 MG tablet Take 1-2 tablets (5-10 mg total) by mouth 2 (two) times daily as needed for muscle spasms. 20 tablet Exavior Kimmons C, PA-C   ibuprofen (ADVIL,MOTRIN) 600 MG tablet Take 1 tablet (600 mg total) by mouth every 6 (six) hours as needed. 30 tablet Belmira Daley C, PA-C   traMADol (ULTRAM) 50  MG tablet Take 1 tablet (50 mg total) by mouth every 6 (six) hours as needed for severe pain. 10 tablet Aava Deland, [place] SpringsHallie C, PA-C     Controlled Substance Prescriptions [place] Controlled Substance Registry consulted? Yes, I have consulted the [place] Controlled Substances Registry for this patient, and feel the risk/benefit ratio today is favorable for proceeding with this prescription for a controlled substance.   Lew DawesWieters, Sarae Nicholes C, PA-C 06/10/18 2001

## 2018-06-10 NOTE — ED Triage Notes (Signed)
Pt presents with back spasms; pt states he has a chronic issue with his back.

## 2018-06-10 NOTE — Discharge Instructions (Signed)
We gave you a shot of Toradol today Use anti-inflammatories for pain/swelling. You may take up to 800 mg Ibuprofen every 8 hours with food. You may supplement Ibuprofen with Tylenol [phone] mg every 8 hours.  You may use flexeril as needed to help with pain. This is a muscle relaxer and causes sedation- please use only at bedtime or when you will be home and not have to drive/work- Begin with 1 tablet, may increase to 2 Tramadol only for severe pain- do not mix with flexeril, do not drive or work after taking

## 2018-10-13 ENCOUNTER — Encounter (HOSPITAL_COMMUNITY): Payer: Self-pay | Admitting: Emergency Medicine

## 2018-10-13 ENCOUNTER — Other Ambulatory Visit: Payer: Self-pay

## 2018-10-13 ENCOUNTER — Ambulatory Visit (HOSPITAL_COMMUNITY)
Admission: EM | Admit: 2018-10-13 | Discharge: 2018-10-13 | Disposition: A | Discharge status: Home / Self Care | Payer: 59 | Attending: Family Medicine | Admitting: Family Medicine

## 2018-10-13 DIAGNOSIS — M62830 Muscle spasm of back: Secondary | ICD-10-CM | POA: Diagnosis not present

## 2018-10-13 MED ORDER — DICLOFENAC SODIUM 75 MG PO TBEC: 75.0000 mg | DELAYED_RELEASE_TABLET | Freq: Two times a day (BID) | ORAL | 0 refills | Status: DC

## 2018-10-13 MED ORDER — KETOROLAC TROMETHAMINE 60 MG/2ML IM SOLN
60.0000 mg | Freq: Once | INTRAMUSCULAR | Status: AC
  Administered 2018-10-13: 60 mg via INTRAMUSCULAR

## 2018-10-13 MED ORDER — KETOROLAC TROMETHAMINE 60 MG/2ML IM SOLN
INTRAMUSCULAR | Status: AC
  Filled 2018-10-13: qty 2

## 2018-10-13 NOTE — ED Triage Notes (Signed)
Pt sts mid back pain worse on left side upon waking this morning

## 2018-10-13 NOTE — ED Provider Notes (Signed)
[facility]    CSN: [id number] Arrival date & time: 10/13/18  1057     History   Chief Complaint Chief Complaint  Patient presents with  . Back Pain    HPI Edward Schroeder is a 38 y.o. male.   Edward Schroeder presents with complaints of left sided mid back pain which he woke with this morning. It is sharp. Worse with movements. 10/10 in severity. States he has had similar in the past but this feels worse than usual for him. No specific injury. No urinary symptoms. No numbness tingling or weakness to arms or legs. No bladder or bowel dysfunction. Hasn't taken any medications for symptoms. The pain doesn't radiate. States he has tried muscle relaxers in the past for similar which didn't help with his symptoms. No fevers. No chest pain .     ROS per HPI, negative if not otherwise mentioned.      History reviewed. No pertinent past medical history.  Patient Active Problem List   Diagnosis Date Noted  . Influenza   . Fever in adult 06/03/2016  . Headache 06/03/2016  . Acute back pain     Past Surgical History:  Procedure Laterality Date  . I&D EXTREMITY Left 08/20/2015   Procedure: IRRIGATION AND DEBRIDEMENT OF FIRST SECOND AND THIRD TOE FRACTURE;  Surgeon: Toni Arthurs, MD;  Location[facility];  Service: Orthopedics;  Laterality: Left;       Home Medications    Prior to Admission medications   Medication Sig Start Date End Date Taking? Authorizing Provider  diclofenac (VOLTAREN) 75 MG EC tablet Take 1 tablet (75 mg total) by mouth 2 (two) times daily. 10/13/18   Georgetta Haber, NP    Family History Family History  Problem Relation Age of Onset  . Healthy Mother   . Healthy Father     Social History Social History   Tobacco Use  . Smoking status: Current Every Day Smoker    Packs/day: 2.00    Types: Cigars  . Smokeless tobacco: Never Used  Substance Use Topics  . Alcohol use: Yes    Comment: occ  . Drug use: Yes    Types: Marijuana      Allergies   Shrimp [shellfish allergy]   Review of Systems Review of Systems   Physical Exam Triage Vital Signs ED Triage Vitals  Enc Vitals Group     BP 10/13/18 1109 112/66     Pulse Rate 10/13/18 1109 (!) 58     Resp 10/13/18 1109 18     Temp 10/13/18 1109 98.4 F (36.9 C)     Temp Source 10/13/18 1109 Oral     SpO2 10/13/18 1109 100 %     Weight --      Height --      Head Circumference --      Peak Flow --      Pain Score 10/13/18 1108 10     Pain Loc --      Pain Edu? --      Excl. in GC? --    No data found.  Updated Vital Signs BP 112/66 (BP Location: Left Arm)   Pulse (!) 58   Temp 98.4 F (36.9 C) (Oral)   Resp 18   SpO2 100%    Physical Exam Constitutional:      Appearance: He is well-developed.  Cardiovascular:     Rate and Rhythm: Normal rate and regular rhythm.  Pulmonary:     Effort: Pulmonary effort  is normal.     Breath sounds: Normal breath sounds.  Musculoskeletal:     Right shoulder: He exhibits tenderness, pain and spasm. He exhibits normal range of motion, no bony tenderness, no laceration, normal pulse and normal strength.       Arms:     Comments: Point tenderness without bony tenderness; no spinous process tenderness; pain exacerbated with movement of left arm; strong radial pulses and cap refill <2 seconds; gross sensation intact  Skin:    General: Skin is warm and dry.  Neurological:     Mental Status: He is alert and oriented to person, place, and time.      UC Treatments / Results  Labs (all labs ordered are listed, but only abnormal results are displayed) Labs Reviewed - No data to display  EKG None  Radiology No results found.  Procedures Procedures (including critical care time)  Medications Ordered in UC Medications  ketorolac (TORADOL) injection 60 mg (60 mg Intramuscular Given 10/13/18 1151)    Initial Impression / Assessment and Plan / UC Course  I have reviewed the triage vital signs and the nursing  notes.  Pertinent labs & imaging results that were available during my care of the patient were reviewed by me and considered in my medical decision making (see chart for details).     Mid back muscle spasm. Patient declines muscle relaxer as states these have not helped in the past. toradol provided in clinic and will try bid diclofenac. If symptoms worsen or do not improve in the next week to return to be seen or to follow up with PCP.  Patient verbalized understanding and agreeable to plan.   Final Clinical Impressions(s) / UC Diagnoses   Final diagnoses:  Muscle spasm of back     Discharge Instructions     Moist heat, massage, stretching of the area to help loosen.  Diclofenac twice a day, take with food, first dose tonight.  Follow up with your primary care provider for persistent symptoms.  Go to er if develop numbness tingling weakness or shortness of breath.    ED Prescriptions    Medication Sig Dispense Auth. Provider   diclofenac (VOLTAREN) 75 MG EC tablet Take 1 tablet (75 mg total) by mouth 2 (two) times daily. 30 tablet Georgetta HaberBurky, Rosana Farnell B, NP     Controlled Substance Prescriptions [place] Controlled Substance Registry consulted? Not Applicable   Georgetta HaberBurky, Jaelynn Currier B, NP 10/13/18 1235

## 2018-10-13 NOTE — Discharge Instructions (Signed)
Moist heat, massage, stretching of the area to help loosen.  Diclofenac twice a day, take with food, first dose tonight.  Follow up with your primary care provider for persistent symptoms.  Go to er if develop numbness tingling weakness or shortness of breath.

## 2018-12-24 ENCOUNTER — Encounter (HOSPITAL_COMMUNITY): Payer: Self-pay

## 2018-12-24 ENCOUNTER — Ambulatory Visit (HOSPITAL_COMMUNITY)
Admission: EM | Admit: 2018-12-24 | Discharge: 2018-12-24 | Disposition: A | Discharge status: Home / Self Care | Payer: 59 | Attending: Emergency Medicine | Admitting: Emergency Medicine

## 2018-12-24 ENCOUNTER — Other Ambulatory Visit: Payer: Self-pay

## 2018-12-24 DIAGNOSIS — K0510 Chronic gingivitis, plaque induced: Secondary | ICD-10-CM | POA: Diagnosis not present

## 2018-12-24 MED ORDER — CHLORHEXIDINE GLUCONATE 0.12 % MT SOLN: OROMUCOSAL | 0 refills | Status: DC

## 2018-12-24 NOTE — Discharge Instructions (Signed)
Brush teeth regularly, floss regularly.  Use of wash twice a day.  Please follow up with a dentist for definitive care.

## 2018-12-24 NOTE — ED Provider Notes (Signed)
[facility]    CSN: [id number] Arrival date & time: 12/24/18  1755      History   Chief Complaint Chief Complaint  Patient presents with  . swollen gums    HPI Edward Schroeder is a 38 y.o. male.   Edward Schroeder presents with complaints of  Swelling and tenderness to his gums. This has been ongoing for approximately 2-3 weeks now. Experiences bleeding when he brushes his teeth. Doesn't follow with a dentist. States he is only moderately consistent with regularly brushing his teeth. No dental pain. No fevers. Has tried some mouthwash which minimally helps. No sores or lesions. Without contributing medical history.     ROS per HPI, negative if not otherwise mentioned.      History reviewed. No pertinent past medical history.  Patient Active Problem List   Diagnosis Date Noted  . Influenza   . Fever in adult 06/03/2016  . Headache 06/03/2016  . Acute back pain     Past Surgical History:  Procedure Laterality Date  . I&D EXTREMITY Left 08/20/2015   Procedure: IRRIGATION AND DEBRIDEMENT OF FIRST SECOND AND THIRD TOE FRACTURE;  Surgeon: Toni ArthursJohn Hewitt, MD;  Location: [facility];  Service: Orthopedics;  Laterality: Left;       Home Medications    Prior to Admission medications   Medication Sig Start Date End Date Taking? Authorizing Provider  chlorhexidine (PERIDEX) 0.12 % solution Swish and spit twice a day 12/24/18   Linus MakoBurky, Natalie B, NP  diclofenac (VOLTAREN) 75 MG EC tablet Take 1 tablet (75 mg total) by mouth 2 (two) times daily. 10/13/18   Georgetta HaberBurky, Natalie B, NP    Family History Family History  Problem Relation Age of Onset  . Healthy Mother   . Healthy Father     Social History Social History   Tobacco Use  . Smoking status: Current Every Day Smoker    Packs/day: 2.00    Types: Cigars  . Smokeless tobacco: Never Used  Substance Use Topics  . Alcohol use: Yes    Comment: occ  . Drug use: Yes    Types: Marijuana     Allergies   Shrimp [shellfish  allergy]   Review of Systems Review of Systems   Physical Exam Triage Vital Signs ED Triage Vitals  Enc Vitals Group     BP 12/24/18 1803 126/74     Pulse Rate 12/24/18 1803 (!) 2     Resp 12/24/18 1803 18     Temp 12/24/18 1803 98 F (36.7 C)     Temp Source 12/24/18 1803 Oral     SpO2 12/24/18 1803 100 %     Weight 12/24/18 1802 200 lb (90.7 kg)     Height --      Head Circumference --      Peak Flow --      Pain Score 12/24/18 1802 7     Pain Loc --      Pain Edu? --      Excl. in GC? --    No data found.  Updated Vital Signs BP 126/74 (BP Location: Right Arm)   Pulse (!) 2   Temp 98 F (36.7 C) (Oral)   Resp 18   Wt 200 lb (90.7 kg)   SpO2 100%   BMI 29.53 kg/m    Physical Exam Constitutional:      Appearance: He is well-developed.  HENT:     Mouth/Throat:     Mouth: No oral lesions.  Dentition: Gingival swelling present. No dental abscesses or gum lesions.     Tongue: No lesions.     Comments: Overall poor dental hygiene noted  Cardiovascular:     Rate and Rhythm: Normal rate.  Pulmonary:     Effort: Pulmonary effort is normal.  Skin:    General: Skin is warm and dry.  Neurological:     Mental Status: He is alert and oriented to person, place, and time.      UC Treatments / Results  Labs (all labs ordered are listed, but only abnormal results are displayed) Labs Reviewed - No data to display  EKG   Radiology No results found.  Procedures Procedures (including critical care time)  Medications Ordered in UC Medications - No data to display  Initial Impression / Assessment and Plan / UC Course  I have reviewed the triage vital signs and the nursing notes.  Pertinent labs & imaging results that were available during my care of the patient were reviewed by me and considered in my medical decision making (see chart for details).    peridex provided. Encouraged better dental hygiene.  Encouraged dental follow up. Patient verbalized  understanding and agreeable to plan.   Final Clinical Impressions(s) / UC Diagnoses   Final diagnoses:  Gingivitis     Discharge Instructions     Brush teeth regularly, floss regularly.  Use of wash twice a day.  Please follow up with a dentist for definitive care.    ED Prescriptions    Medication Sig Dispense Auth. Provider   chlorhexidine (PERIDEX) 0.12 % solution Swish and spit twice a day 473 mL Augusto Gamble B, NP     Controlled Substance Prescriptions [place] Controlled Substance Registry consulted? Not Applicable   Zigmund Gottron, NP 12/24/18 1836

## 2018-12-24 NOTE — ED Triage Notes (Signed)
Pt states his gums are swollen.  This has been going on for more then 3 weeks.

## 2019-03-13 ENCOUNTER — Ambulatory Visit (HOSPITAL_COMMUNITY)
Admission: EM | Admit: 2019-03-13 | Discharge: 2019-03-13 | Disposition: A | Discharge status: Home / Self Care | Payer: 59 | Attending: Emergency Medicine | Admitting: Emergency Medicine

## 2019-03-13 ENCOUNTER — Ambulatory Visit (INDEPENDENT_AMBULATORY_CARE_PROVIDER_SITE_OTHER): Payer: 59

## 2019-03-13 ENCOUNTER — Other Ambulatory Visit: Payer: Self-pay

## 2019-03-13 ENCOUNTER — Encounter (HOSPITAL_COMMUNITY): Payer: Self-pay

## 2019-03-13 DIAGNOSIS — J209 Acute bronchitis, unspecified: Secondary | ICD-10-CM | POA: Diagnosis not present

## 2019-03-13 DIAGNOSIS — F17210 Nicotine dependence, cigarettes, uncomplicated: Secondary | ICD-10-CM | POA: Insufficient documentation

## 2019-03-13 DIAGNOSIS — J069 Acute upper respiratory infection, unspecified: Secondary | ICD-10-CM | POA: Insufficient documentation

## 2019-03-13 DIAGNOSIS — R05 Cough: Secondary | ICD-10-CM | POA: Diagnosis not present

## 2019-03-13 DIAGNOSIS — R0989 Other specified symptoms and signs involving the circulatory and respiratory systems: Secondary | ICD-10-CM | POA: Diagnosis not present

## 2019-03-13 DIAGNOSIS — Z20828 Contact with and (suspected) exposure to other viral communicable diseases: Secondary | ICD-10-CM | POA: Diagnosis not present

## 2019-03-13 MED ORDER — AEROCHAMBER PLUS MISC: 2 refills | Status: DC

## 2019-03-13 MED ORDER — ALBUTEROL SULFATE HFA 108 (90 BASE) MCG/ACT IN AERS: 1.0000 | INHALATION_SPRAY | Freq: Four times a day (QID) | RESPIRATORY_TRACT | 0 refills | Status: DC | PRN

## 2019-03-13 MED ORDER — BENZONATATE 200 MG PO CAPS: 200.0000 mg | ORAL_CAPSULE | Freq: Three times a day (TID) | ORAL | 0 refills | Status: DC | PRN

## 2019-03-13 MED ORDER — PREDNISONE 20 MG PO TABS: 40.0000 mg | ORAL_TABLET | Freq: Every day | ORAL | 0 refills | Status: AC

## 2019-03-13 NOTE — ED Triage Notes (Signed)
Pt states he is having cough, nasal congestion, chills and body aches x 1 week. Pt is taking Robitussin, Alka Seltzer and Nyquil.

## 2019-03-13 NOTE — Discharge Instructions (Addendum)
Your chest x-ray was negative for pneumonia.  I suspect that this is bronchitis, which is usually viral and does not need to be treated with antibiotics.  2 puffs from your albuterol inhaler every 4 hours for the next 2 days, and then every 6 hours for 2 days after that, then you may use it as needed.  Finish the steroids.  Tessalon as needed for cough.  You may continue the cold medicine.  Follow-up with primary care physician of your choice, see list below  Below is a list of primary care practices who are taking new patients for you to follow-up with.  [facility] at [facility] [place] [address] [place], [place] [address] [phone]  [place], [place] [address] [phone]  [facility] [phone] [place] [address]  [facility]: [place]  [phone]  [place] [facility]: [place]   [phone]  [facility]: [address] [place]  [phone]  [place] [facility] : [address] [place] [phone]  [facility]: [address] [place] [address] [phone]  [facility]: [place] [phone]  Dr. Blanchie Serve [place]  [phone]  Dr. Benito Mccreedy, [facility]. [place], [place] [address]  [phone]  Go to www.goodrx.com to look up your medications. This will give you a list of where you can find your prescriptions at the most affordable prices. Or ask the pharmacist what the cash price is, or if they have any  other discount programs available to help make your medication more affordable. This can be less expensive than what you would pay with insurance.

## 2019-03-13 NOTE — ED Provider Notes (Signed)
HPI  SUBJECTIVE:  Edward Schroeder is a 38 y.o. male who presents with 1 week cough productive of whitish-green phlegm, chest congestion.  He states it started off with URI symptoms with body aches, nasal congestion, shortness of breath.  States that this has resolved.  He denies fevers, headaches, sinus pain or pressure, rhinorrhea, postnasal drip.  No sore throat.  No loss of sense of smell or taste.  No nausea, vomiting, diarrhea, abdominal pain.  No wheezing, chest pain.  States that he is getting better except the cough is "not going away".  No antibiotics in the past 3 months.  No antipyretic in the past 4 to 6 hours.  He has tried Robitussin, NyQuil, Alka-Seltzer, cough drops, honey lemon tea.  The cough drops help.  No aggravating factors.  No allergy or GERD symptoms.  Past medical history negative for GERD.  He is a smoker.  No history of asthma, emphysema, COPD, diabetes, hypertension.  PMD: None.  History reviewed. No pertinent past medical history.  Past Surgical History:  Procedure Laterality Date  . I&D EXTREMITY Left 08/20/2015   Procedure: IRRIGATION AND DEBRIDEMENT OF FIRST SECOND AND THIRD TOE FRACTURE;  Surgeon: Toni Arthurs, MD;  Location: [facility] OR;  Service: Orthopedics;  Laterality: Left;    Family History  Problem Relation Age of Onset  . Healthy Mother   . Healthy Father     Social History   Tobacco Use  . Smoking status: Current Every Day Smoker    Packs/day: 2.00    Types: Cigars  . Smokeless tobacco: Never Used  Substance Use Topics  . Alcohol use: Yes    Comment: occ  . Drug use: Yes    Types: Marijuana    No current facility-administered medications for this encounter.   Current Outpatient Medications:  .  Chlorphen-PE-Acetaminophen (ROBITUSSIN COLD/CONGESTION PO), Take by mouth., Disp: , Rfl:  .  Phenyleph-CPM-DM-APAP (ALKA-SELTZER PLUS COLD & COUGH) 09-19-08-325 MG CAPS, Take by mouth., Disp: , Rfl:  .  Pseudoeph-Doxylamine-DM-APAP (NYQUIL PO), Take by  mouth., Disp: , Rfl:  .  albuterol (VENTOLIN HFA) 108 (90 Base) MCG/ACT inhaler, Inhale 1-2 puffs into the lungs every 6 (six) hours as needed for wheezing or shortness of breath., Disp: 18 g, Rfl: 0 .  benzonatate (TESSALON) 200 MG capsule, Take 1 capsule (200 mg total) by mouth 3 (three) times daily as needed for cough., Disp: 30 capsule, Rfl: 0 .  predniSONE (DELTASONE) 20 MG tablet, Take 2 tablets (40 mg total) by mouth daily with breakfast for 5 days., Disp: 10 tablet, Rfl: 0 .  Spacer/Aero-Holding Chambers (AEROCHAMBER PLUS) inhaler, Use as instructed, Disp: 1 each, Rfl: 2  Allergies  Allergen Reactions  . Shrimp [Shellfish Allergy] Hives     ROS  As noted in HPI.   Physical Exam  BP 119/78 (BP Location: Right Arm)   Pulse 65   Temp 97.8 F (36.6 C) (Temporal)   Resp 15   SpO2 98%   Constitutional: Well developed, well nourished, no acute distress Eyes:  EOMI, conjunctiva normal bilaterally HENT: Normocephalic, atraumatic,mucus membranes moist.  No nasal congestion.  No sinus tenderness.  No postnasal drip. Respiratory: Normal inspiratory effort, fair air movement.  Diffuse wheezing and rhonchi that do not clear with coughing throughout all lung fields Cardiovascular: Normal rate, regular rhythm no murmurs rubs or gallops GI: nondistended skin: No rash, skin intact Musculoskeletal: no deformities Neurologic: Alert & oriented x 3, no focal neuro deficits Psychiatric: Speech and behavior appropriate  ED Course   Medications - No data to display  Orders Placed This Encounter  Procedures  . Novel Coronavirus, NAA (Hosp order, Send-out to Ref Lab; TAT 18-24 hrs    Standing Status:   Standing    Number of Occurrences:   1    Order Specific Question:   Is this test for diagnosis or screening    Answer:   Screening    Order Specific Question:   Symptomatic for COVID-19 as defined by CDC    Answer:   No    Order Specific Question:   Hospitalized for COVID-19     Answer:   No    Order Specific Question:   Admitted to ICU for COVID-19    Answer:   No    Order Specific Question:   Previously tested for COVID-19    Answer:   No    Order Specific Question:   Resident in a congregate (group) care setting    Answer:   No    Order Specific Question:   Employed in healthcare setting    Answer:   No  . DG Chest 2 View    Standing Status:   Standing    Number of Occurrences:   1    Order Specific Question:   Reason for Exam (SYMPTOM  OR DIAGNOSIS REQUIRED)    Answer:   r/o PNA    No results found for this or any previous visit (from the past 24 hour(s)). Dg Chest 2 View  Result Date: 03/13/2019 CLINICAL DATA:  Chest pain EXAM: CHEST - 2 VIEW COMPARISON:  02/05/2018 FINDINGS: The heart size and mediastinal contours are within normal limits. Both lungs are clear. The visualized skeletal structures are unremarkable. IMPRESSION: No active cardiopulmonary disease. Electronically Signed   By: Jasmine PangKim  Fujinaga M.D.   On: 03/13/2019 19:56    ED Clinical Impression  1. Acute bronchitis, unspecified organism      ED Assessment/Plan  Suspect bronchitis status post URI.  Checking chest x-ray to rule out pneumonia.  Plan to send home with scheduled albuterol with a spacer, 2 puffs every 4 hours for 2 days, every 6 hours for 2 days, then as needed, Tessalon because he states that worked well for him in the past.  Prednisone 40 mg for 5 days.  Deferring antibiotic therapy for now.  Covid test sent.  Follow-up with PMD of choice or here in 1 week if not getting better, to the ER if he gets worse.  Providing primary care list for ongoing care.  Reviewed imaging independently.  Normal chest x-ray see radiology report for full details.  Discussed  imaging, MDM, treatment plan, and plan for follow-up with patient. Discussed sn/sx that should prompt return to the ED. patient agrees with plan.   Meds ordered this encounter  Medications  . albuterol (VENTOLIN HFA) 108  (90 Base) MCG/ACT inhaler    Sig: Inhale 1-2 puffs into the lungs every 6 (six) hours as needed for wheezing or shortness of breath.    Dispense:  18 g    Refill:  0  . Spacer/Aero-Holding Chambers (AEROCHAMBER PLUS) inhaler    Sig: Use as instructed    Dispense:  1 each    Refill:  2  . benzonatate (TESSALON) 200 MG capsule    Sig: Take 1 capsule (200 mg total) by mouth 3 (three) times daily as needed for cough.    Dispense:  30 capsule    Refill:  0  . predniSONE (  DELTASONE) 20 MG tablet    Sig: Take 2 tablets (40 mg total) by mouth daily with breakfast for 5 days.    Dispense:  10 tablet    Refill:  0    *This clinic note was created using [employer]. Therefore, there may be occasional mistakes despite careful proofreading.   ?    Melynda Ripple, MD 03/13/19 2034

## 2019-03-16 LAB — NOVEL CORONAVIRUS, NAA (HOSP ORDER, SEND-OUT TO REF LAB; TAT 18-24 HRS): SARS-CoV-2, NAA: NOT DETECTED

## 2019-12-07 ENCOUNTER — Emergency Department (HOSPITAL_COMMUNITY): Payer: Self-pay

## 2019-12-07 ENCOUNTER — Other Ambulatory Visit: Payer: Self-pay

## 2019-12-07 ENCOUNTER — Encounter (HOSPITAL_COMMUNITY): Payer: Self-pay | Admitting: *Deleted

## 2019-12-07 ENCOUNTER — Emergency Department (HOSPITAL_COMMUNITY)
Admission: EM | Admit: 2019-12-07 | Discharge: 2019-12-07 | Disposition: A | Discharge status: Home / Self Care | Payer: Self-pay | Attending: Emergency Medicine | Admitting: Emergency Medicine

## 2019-12-07 DIAGNOSIS — R079 Chest pain, unspecified: Secondary | ICD-10-CM | POA: Insufficient documentation

## 2019-12-07 DIAGNOSIS — Z5321 Procedure and treatment not carried out due to patient leaving prior to being seen by health care provider: Secondary | ICD-10-CM | POA: Insufficient documentation

## 2019-12-07 LAB — BASIC METABOLIC PANEL
Anion gap: 9 (ref 5–15)
BUN: 17 mg/dL (ref 6–20)
CO2: 26 mmol/L (ref 22–32)
Calcium: 9.3 mg/dL (ref 8.9–10.3)
Chloride: 100 mmol/L (ref 98–111)
Creatinine, Ser: 1 mg/dL (ref 0.61–1.24)
GFR calc Af Amer: >60 mL/min (ref >60)
GFR calc non Af Amer: >60 mL/min (ref >60)
Glucose, Bld: 86 mg/dL (ref 70–99)
Potassium: 4.3 mmol/L (ref 3.5–5.1)
Sodium: 135 mmol/L (ref 135–145)

## 2019-12-07 LAB — CBC
HCT: 48 % (ref 39.0–52.0)
Hemoglobin: 16.1 g/dL (ref 13.0–17.0)
MCH: 30.3 pg (ref 26.0–34.0)
MCHC: 33.5 g/dL (ref 30.0–36.0)
MCV: 90.4 fL (ref 80.0–100.0)
Platelets: 294 10*3/uL (ref 150–400)
RBC: 5.31 MIL/uL (ref 4.22–5.81)
RDW: 15.1 % (ref 11.5–15.5)
WBC: 13.1 10*3/uL — ABNORMAL HIGH (ref 4.0–10.5)
nRBC: 0 % (ref 0.0–0.2)

## 2019-12-07 LAB — TROPONIN I (HIGH SENSITIVITY): Troponin I (High Sensitivity): 4 ng/L (ref <18)

## 2019-12-07 MED ORDER — SODIUM CHLORIDE 0.9% FLUSH: 3.0000 mL | Freq: Once | INTRAVENOUS | Status: DC

## 2019-12-07 NOTE — ED Triage Notes (Signed)
The pt has had chest pain  For 3 hours he has  Had theis chest pain 2-3 times a week  For 2 years

## 2019-12-07 NOTE — ED Notes (Signed)
Pt is gone, is not responding to name call

## 2020-01-03 ENCOUNTER — Emergency Department (HOSPITAL_BASED_OUTPATIENT_CLINIC_OR_DEPARTMENT_OTHER)
Admission: EM | Admit: 2020-01-03 | Discharge: 2020-01-03 | Disposition: A | Discharge status: Home / Self Care | Payer: Self-pay | Attending: Emergency Medicine | Admitting: Emergency Medicine

## 2020-01-03 ENCOUNTER — Encounter (HOSPITAL_BASED_OUTPATIENT_CLINIC_OR_DEPARTMENT_OTHER): Payer: Self-pay | Admitting: Emergency Medicine

## 2020-01-03 ENCOUNTER — Other Ambulatory Visit: Payer: Self-pay

## 2020-01-03 DIAGNOSIS — M549 Dorsalgia, unspecified: Secondary | ICD-10-CM | POA: Insufficient documentation

## 2020-01-03 DIAGNOSIS — Z5321 Procedure and treatment not carried out due to patient leaving prior to being seen by health care provider: Secondary | ICD-10-CM | POA: Insufficient documentation

## 2020-01-03 NOTE — ED Triage Notes (Signed)
Pt here with left side back pain. States he woke up with it and it is kidney pain but it is localized just to the back.

## 2020-04-10 ENCOUNTER — Ambulatory Visit (HOSPITAL_COMMUNITY)
Admission: EM | Admit: 2020-04-10 | Discharge: 2020-04-10 | Disposition: A | Discharge status: Home / Self Care | Payer: Self-pay | Attending: Family Medicine | Admitting: Family Medicine

## 2020-04-10 ENCOUNTER — Encounter (HOSPITAL_COMMUNITY): Payer: Self-pay

## 2020-04-10 ENCOUNTER — Other Ambulatory Visit: Payer: Self-pay

## 2020-04-10 ENCOUNTER — Ambulatory Visit (INDEPENDENT_AMBULATORY_CARE_PROVIDER_SITE_OTHER): Payer: Self-pay

## 2020-04-10 DIAGNOSIS — S62339D Displaced fracture of neck of unspecified metacarpal bone, subsequent encounter for fracture with routine healing: Secondary | ICD-10-CM

## 2020-04-10 DIAGNOSIS — M79641 Pain in right hand: Secondary | ICD-10-CM

## 2020-04-10 MED ORDER — TRAMADOL HCL 50 MG PO TABS: 50.0000 mg | ORAL_TABLET | Freq: Four times a day (QID) | ORAL | 0 refills | Status: DC | PRN

## 2020-04-10 NOTE — ED Provider Notes (Signed)
[facility]    CSN: [id number] Arrival date & time: 04/10/20  1301      History   Chief Complaint Chief Complaint  Patient presents with  . Hand Injury    HPI Edward Schroeder is a 39 y.o. male.   HPI  Patient punched a wall yesterday.  He is here for evaluation today.  He has pain and swelling in his hand. Tetanus in 2017 History reviewed. No pertinent past medical history.  Patient Active Problem List   Diagnosis Date Noted  . Influenza   . Fever in adult 06/03/2016  . Headache 06/03/2016  . Acute back pain     Past Surgical History:  Procedure Laterality Date  . I & D EXTREMITY Left 08/20/2015   Procedure: IRRIGATION AND DEBRIDEMENT OF FIRST SECOND AND THIRD TOE FRACTURE;  Surgeon: Toni Arthurs, MD;  Location: [facility] OR;  Service: Orthopedics;  Laterality: Left;       Home Medications    Prior to Admission medications   Medication Sig Start Date End Date Taking? Authorizing Provider  albuterol (VENTOLIN HFA) 108 (90 Base) MCG/ACT inhaler Inhale 1-2 puffs into the lungs every 6 (six) hours as needed for wheezing or shortness of breath. 03/13/19  Yes Domenick Gong, MD  benzonatate (TESSALON) 200 MG capsule Take 1 capsule (200 mg total) by mouth 3 (three) times daily as needed for cough. 03/13/19   Domenick Gong, MD  Chlorphen-PE-Acetaminophen (ROBITUSSIN COLD/CONGESTION PO) Take by mouth.    [provider]  Phenyleph-CPM-DM-APAP (ALKA-SELTZER PLUS COLD & COUGH) 09-19-08-325 MG CAPS Take by mouth.    [provider]  Pseudoeph-Doxylamine-DM-APAP (NYQUIL PO) Take by mouth.    [provider]  Spacer/Aero-Holding Chambers (AEROCHAMBER PLUS) inhaler Use as instructed 03/13/19   Domenick Gong, MD  traMADol (ULTRAM) 50 MG tablet Take 1 tablet (50 mg total) by mouth every 6 (six) hours as needed. 04/10/20   Eustace Moore, MD    Family History Family History  Problem Relation Age of Onset  . Healthy Mother   . Healthy  Father     Social History Social History   Tobacco Use  . Smoking status: Current Every Day Smoker    Packs/day: 2.00    Types: Cigars  . Smokeless tobacco: Never Used  Vaping Use  . Vaping Use: Never used  Substance Use Topics  . Alcohol use: Yes    Comment: occ  . Drug use: Yes    Types: Marijuana     Allergies   Shellfish allergy   Review of Systems Review of Systems See HPI  Physical Exam Triage Vital Signs ED Triage Vitals  Enc Vitals Group     BP 04/10/20 1328 128/75     Pulse Rate 04/10/20 1328 (!) 58     Resp 04/10/20 1328 17     Temp 04/10/20 1328 98.6 F (37 C)     Temp Source 04/10/20 1328 Oral     SpO2 04/10/20 1328 100 %     Weight --      Height --      Head Circumference --      Peak Flow --      Pain Score 04/10/20 1327 6     Pain Loc --      Pain Edu? --      Excl. in GC? --    No data found.  Updated Vital Signs BP 128/75 (BP Location: Left Arm)   Pulse (!) 58   Temp 98.6 F (  37 C) (Oral)   Resp 17   SpO2 100%      Physical Exam Constitutional:      General: He is not in acute distress.    Appearance: He is well-developed.  HENT:     Head: Normocephalic and atraumatic.  Eyes:     Conjunctiva/sclera: Conjunctivae normal.     Pupils: Pupils are equal, round, and reactive to light.  Cardiovascular:     Rate and Rhythm: Normal rate.  Pulmonary:     Effort: Pulmonary effort is normal. No respiratory distress.  Abdominal:     Palpations: Abdomen is soft.  Musculoskeletal:        General: Normal range of motion.     Cervical back: Normal range of motion.     Comments: Right hand is grossly swollen.  Tenderness over the fifth metacarpal distally.  Patient resists flexion and extension of finger.  Sensory exam is normal.  Superficial wound is seen in the palm over the hypothenar eminence.  No bleeding.  Skin:    General: Skin is warm and dry.  Neurological:     Mental Status: He is alert.      UC Treatments / Results    Labs (all labs ordered are listed, but only abnormal results are displayed) Labs Reviewed - No data to display  EKG   Radiology No results found.  Procedures Procedures (including critical care time)  Medications Ordered in UC Medications - No data to display  Initial Impression / Assessment and Plan / UC Course  I have reviewed the triage vital signs and the nursing notes.  Pertinent labs & imaging results that were available during my care of the patient were reviewed by me and considered in my medical decision making (see chart for details).     Final Clinical Impressions(s) / UC Diagnoses   Final diagnoses:  Closed boxer's fracture with routine healing, subsequent encounter     Discharge Instructions     Use ice and elevation to reduce the swelling Take the pain medicine as needed Call tomorrow to get an appointment with orthopedic next week     ED Prescriptions    Medication Sig Dispense Auth. Provider   traMADol (ULTRAM) 50 MG tablet Take 1 tablet (50 mg total) by mouth every 6 (six) hours as needed. 15 tablet Eustace Moore, MD     I have reviewed the PDMP during this encounter.   Eustace Moore, MD 04/10/20 1426

## 2020-04-10 NOTE — ED Notes (Signed)
Forearm splint placed by ortho tech. Sling placed by [facility] RN

## 2020-04-10 NOTE — ED Triage Notes (Signed)
Pt c/o pain to right hand. States he struck a wall last night, unsure if wall was brick or sheet rock.  Large amount of edema to right hand and fingers, abrasion to base of thumb/palm area. Unable to extend or make a fist completely. No meds taken PTA.   +2 radial and ulnar pulses, brisk cap refill.  Ice pack given.

## 2020-04-10 NOTE — Discharge Instructions (Signed)
Use ice and elevation to reduce the swelling Take the pain medicine as needed Call tomorrow to get an appointment with orthopedic next week

## 2020-04-10 NOTE — Progress Notes (Signed)
Orthopedic Tech Progress Note Patient Details:  Natthew Marlatt 1981-03-03 [id number]  Ortho Devices Type of Ortho Device: Ulna gutter splint Ortho Device/Splint Location: Right Upper Extremity Ortho Device/Splint Interventions: Ordered, Application   Post Interventions Patient Tolerated: Well Instructions Provided: Care of device, Poper ambulation with device   Zadok Holaway P Harle Stanford 04/10/2020, 4:02 PM

## 2021-09-19 ENCOUNTER — Emergency Department (HOSPITAL_COMMUNITY): Payer: 59

## 2021-09-19 ENCOUNTER — Encounter (HOSPITAL_COMMUNITY): Payer: Self-pay | Admitting: Emergency Medicine

## 2021-09-19 ENCOUNTER — Other Ambulatory Visit: Payer: Self-pay

## 2021-09-19 ENCOUNTER — Emergency Department (HOSPITAL_COMMUNITY)
Admission: EM | Admit: 2021-09-19 | Discharge: 2021-09-19 | Discharge status: Against Medical Advice | Payer: 59 | Attending: Emergency Medicine | Admitting: Emergency Medicine

## 2021-09-19 DIAGNOSIS — R0789 Other chest pain: Secondary | ICD-10-CM | POA: Diagnosis not present

## 2021-09-19 DIAGNOSIS — Z5321 Procedure and treatment not carried out due to patient leaving prior to being seen by health care provider: Secondary | ICD-10-CM | POA: Insufficient documentation

## 2021-09-19 LAB — TROPONIN I (HIGH SENSITIVITY): Troponin I (High Sensitivity): 3 ng/L (ref <18)

## 2021-09-19 LAB — CBC
HCT: 43.8 % (ref 39.0–52.0)
Hemoglobin: 14.4 g/dL (ref 13.0–17.0)
MCH: 30.1 pg (ref 26.0–34.0)
MCHC: 32.9 g/dL (ref 30.0–36.0)
MCV: 91.6 fL (ref 80.0–100.0)
Platelets: 289 10*3/uL (ref 150–400)
RBC: 4.78 MIL/uL (ref 4.22–5.81)
RDW: 14.5 % (ref 11.5–15.5)
WBC: 8.9 10*3/uL (ref 4.0–10.5)
nRBC: 0 % (ref 0.0–0.2)

## 2021-09-19 LAB — BASIC METABOLIC PANEL
Anion gap: 7 (ref 5–15)
BUN: 14 mg/dL (ref 6–20)
CO2: 24 mmol/L (ref 22–32)
Calcium: 9 mg/dL (ref 8.9–10.3)
Chloride: 107 mmol/L (ref 98–111)
Creatinine, Ser: 1.12 mg/dL (ref 0.61–1.24)
GFR, Estimated: >60 mL/min (ref >60)
Glucose, Bld: 74 mg/dL (ref 70–99)
Potassium: 4.3 mmol/L (ref 3.5–5.1)
Sodium: 138 mmol/L (ref 135–145)

## 2021-09-19 NOTE — ED Triage Notes (Addendum)
BIB EMS from home, was mowing his grass, started feeling CP.  Diaphoretic.  CP started  to alleviate after going back into house but symptoms recurred.  Pt was diaphoretic and in pain when EMS arrived.  Elevation reported in V2, V3, an AVF on EKG.  Pt states he had a similar episode in [place] before with increased troponins and bradycardia.  Was given 324 ASA.  18G LFA ?

## 2021-09-19 NOTE — ED Provider Triage Note (Signed)
Emergency Medicine Provider Triage Evaluation Note ? ?Edward Schroeder , a 41 y.o. male  was evaluated in triage.  Pt complains of chest pain and tightness when he was mowing the grass today.  Had radiation towards the left shoulder.  Symptoms began to relieve on their own right before receiving 324 ASA from EMS.  Had this happen also a few days prior, which again relieved on its own with rest.  Hx of NSTEMI with bradycardia in [place].  Denies nausea, vomiting, palpitations.  No recent fever. ? ?Review of Systems  ?Positive: As above ?Negative: As above ? ?Physical Exam  ?BP 110/74 (BP Location: Left Arm)   Pulse (!) 51   Temp 98.9 ?F (37.2 ?C) (Oral)   Resp 16   SpO2 97%  ?Gen:   Awake, no distress   ?Resp:  Normal effort CTAB ?MSK:   Moves extremities without difficulty  ?Other:  Bradycardic without M/R/G.  Radial pulses 2+ bilaterally.  Chest non-TTP ? ?Medical Decision Making  ?Medically screening exam initiated at 5:25 PM.  Appropriate orders placed.  [name] was informed that the remainder of the evaluation will be completed by another provider, this initial triage assessment does not replace that evaluation, and the importance of remaining in the ED until their evaluation is complete. ? ?Labs, imaging, EKG ordered ?  ?Cecil Cobbs, PA-C ?09/19/21 1735 ? ?

## 2021-10-07 ENCOUNTER — Emergency Department (EMERGENCY_DEPARTMENT_HOSPITAL): Payer: 59

## 2021-10-07 ENCOUNTER — Other Ambulatory Visit: Payer: Self-pay

## 2021-10-07 ENCOUNTER — Emergency Department (HOSPITAL_COMMUNITY): Payer: 59

## 2021-10-07 ENCOUNTER — Emergency Department (HOSPITAL_COMMUNITY)
Admission: EM | Admit: 2021-10-07 | Discharge: 2021-10-07 | Disposition: A | Discharge status: Home / Self Care | Payer: 59 | Attending: Emergency Medicine | Admitting: Emergency Medicine

## 2021-10-07 ENCOUNTER — Encounter (HOSPITAL_COMMUNITY): Payer: Self-pay

## 2021-10-07 DIAGNOSIS — F17210 Nicotine dependence, cigarettes, uncomplicated: Secondary | ICD-10-CM | POA: Diagnosis not present

## 2021-10-07 DIAGNOSIS — R778 Other specified abnormalities of plasma proteins: Secondary | ICD-10-CM | POA: Diagnosis not present

## 2021-10-07 DIAGNOSIS — R072 Precordial pain: Secondary | ICD-10-CM | POA: Diagnosis not present

## 2021-10-07 DIAGNOSIS — R079 Chest pain, unspecified: Secondary | ICD-10-CM

## 2021-10-07 DIAGNOSIS — I2510 Atherosclerotic heart disease of native coronary artery without angina pectoris: Secondary | ICD-10-CM | POA: Insufficient documentation

## 2021-10-07 LAB — BASIC METABOLIC PANEL
Anion gap: 6 (ref 5–15)
BUN: 13 mg/dL (ref 6–20)
CO2: 26 mmol/L (ref 22–32)
Calcium: 9 mg/dL (ref 8.9–10.3)
Chloride: 107 mmol/L (ref 98–111)
Creatinine, Ser: 1 mg/dL (ref 0.61–1.24)
GFR, Estimated: >60 mL/min (ref >60)
Glucose, Bld: 109 mg/dL — ABNORMAL HIGH (ref 70–99)
Potassium: 4.4 mmol/L (ref 3.5–5.1)
Sodium: 139 mmol/L (ref 135–145)

## 2021-10-07 LAB — CBC
HCT: 43.9 % (ref 39.0–52.0)
Hemoglobin: 14.6 g/dL (ref 13.0–17.0)
MCH: 30.4 pg (ref 26.0–34.0)
MCHC: 33.3 g/dL (ref 30.0–36.0)
MCV: 91.5 fL (ref 80.0–100.0)
Platelets: 286 10*3/uL (ref 150–400)
RBC: 4.8 MIL/uL (ref 4.22–5.81)
RDW: 14.7 % (ref 11.5–15.5)
WBC: 7.9 10*3/uL (ref 4.0–10.5)
nRBC: 0 % (ref 0.0–0.2)

## 2021-10-07 LAB — CBG MONITORING, ED: Glucose-Capillary: 133 mg/dL — ABNORMAL HIGH (ref 70–99)

## 2021-10-07 LAB — TROPONIN I (HIGH SENSITIVITY)
Troponin I (High Sensitivity): 11 ng/L (ref <18)
Troponin I (High Sensitivity): 29 ng/L — ABNORMAL HIGH (ref <18)
Troponin I (High Sensitivity): 30 ng/L — ABNORMAL HIGH (ref <18)

## 2021-10-07 MED ORDER — IOHEXOL 350 MG/ML SOLN
100.0000 mL | Freq: Once | INTRAVENOUS | Status: AC | PRN
  Administered 2021-10-07: 100 mL via INTRAVENOUS

## 2021-10-07 MED ORDER — NITROGLYCERIN 0.4 MG SL SUBL
0.8000 mg | SUBLINGUAL_TABLET | Freq: Once | SUBLINGUAL | Status: AC
  Administered 2021-10-07: 0.8 mg via SUBLINGUAL
  Filled 2021-10-07: qty 2

## 2021-10-07 NOTE — ED Triage Notes (Signed)
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 Pain was gone before EMS arrival. Patient with history of MI 2 months ago. Patient denies pain at this time.

## 2021-10-07 NOTE — Consult Note (Addendum)
Cardiology Consultation:   Patient ID: Edward Schroeder MRN: [id number]; DOB: [birth year]  Admit date: 10/07/2021 Date of Consult: 10/07/2021  PCP:  [facility]   [facility] Providers Cardiologist:  New   Patient Profile:   Edward Schroeder is a 41 y.o. male with no significant PMH who is being seen 10/07/2021 for the evaluation of chest pain at the request of Edward Crigler PA.  History of Present Illness:   Edward Schroeder with no known PMH who presented to the ER today with chief complaints of chest pain.  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  Pain is located at midsternal area and radiating to left shoulder.  Symptoms lasted 15 to 20 minutes and resolved with resting and aspirin.  Pain was intense 10/10, tight and squeezing in quality.  He states pain was severe enough to cause him collapse, denied syncope or loss of consciousness.  He reports associated diaphoresis and dizziness.  Patient reports he has recently visited [place] 12-month ago and was admitted to the hospital with chest pain.  He reports his troponin was elevated from 1 50-300 and he did have a cardiac catheterization where he was found to have a no blockage and did not require any cardiac stents.  He showed me that he was hospitalized at [facility], but does not have actual cardiac catheterization report.  Patient states he has had similar chest pain in the past.  He denied any history of hypertension, hyperlipidemia, stroke, diabetes.  He smokes cigar daily, drinks alcohol occasionally, denied illicit drug use.  He denied any family history of cardiac disease.  He states he supposed to see a cardiology at [facility] soon.   Admission diagnostic here revealed unremarkable CBC and BMP.  High sensitive troponin 11 >29. CXR showed no acute finding. EKG showed sinus bradycardia 55 bpm, no acute ST-T changes. He is hemodynamically stable at ED.  Cardiology is  consulted to evaluate chest pain.   History reviewed. No pertinent past medical history.  Past Surgical History:  Procedure Laterality Date   I & D EXTREMITY Left 08/20/2015   Procedure: IRRIGATION AND DEBRIDEMENT OF FIRST SECOND AND THIRD TOE FRACTURE;  Surgeon: Toni Arthurs, MD;  Location: [facility] OR;  Service: Orthopedics;  Laterality: Left;     Home Medications:  Prior to Admission medications   Medication Sig Start Date End Date Taking? Authorizing Provider  albuterol (VENTOLIN HFA) 108 (90 Base) MCG/ACT inhaler Inhale 1-2 puffs into the lungs every 6 (six) hours as needed for wheezing or shortness of breath. 03/13/19   Domenick Gong, MD  benzonatate (TESSALON) 200 MG capsule Take 1 capsule (200 mg total) by mouth 3 (three) times daily as needed for cough. 03/13/19   Domenick Gong, MD  Chlorphen-PE-Acetaminophen (ROBITUSSIN COLD/CONGESTION PO) Take by mouth.    [provider]  Phenyleph-CPM-DM-APAP (ALKA-SELTZER PLUS COLD & COUGH) 09-19-08-325 MG CAPS Take by mouth.    [provider]  Pseudoeph-Doxylamine-DM-APAP (NYQUIL PO) Take by mouth.    [provider]  Spacer/Aero-Holding Chambers (AEROCHAMBER PLUS) inhaler Use as instructed 03/13/19   Domenick Gong, MD  traMADol (ULTRAM) 50 MG tablet Take 1 tablet (50 mg total) by mouth every 6 (six) hours as needed. 04/10/20   Eustace Moore, MD    Inpatient Medications: Scheduled Meds:  Continuous Infusions:  PRN Meds:   Allergies:    Allergies  Allergen Reactions   Shellfish Allergy Hives    Social History:  Social History   Socioeconomic History   Marital status: Married    Spouse name: Not on file   Number of children: Not on file   Years of education: Not on file   Highest education level: Not on file  Occupational History   Not on file  Tobacco Use   Smoking status: Every Day    Packs/day: 2.00    Types: Cigars, Cigarettes   Smokeless tobacco: Never  Vaping Use   Vaping Use:  Never used  Substance and Sexual Activity   Alcohol use: Yes    Comment: occ   Drug use: Yes    Types: Marijuana   Sexual activity: Not on file  Other Topics Concern   Not on file  Social History Narrative   Not on file   Social Determinants of Health   Financial Resource Strain: Not on file  Food Insecurity: Not on file  Transportation Needs: Not on file  Physical Activity: Not on file  Stress: Not on file  Social Connections: Not on file  Intimate Partner Violence: Not on file    Family History:   Family History  Problem Relation Age of Onset   Healthy Mother    Healthy Father      ROS:  Constitutional: Denied fever, chills, malaise Eyes: Denied vision change or loss Ears/Nose/Mouth/Throat: Denied ear ache, sore throat, coughing, sinus pain Cardiovascular: see HPI  Respiratory: denied shortness of breath Gastrointestinal: Denied nausea, vomiting, abdominal pain, diarrhea Genital/Urinary: Denied dysuria, hematuria, urinary frequency/urgency Musculoskeletal: Denied muscle ache, joint pain, weakness Skin: Denied rash, wound Neuro: Dizziness Psych: Denied history of depression/anxiety  Endocrine: Denied history of diabetes   Physical Exam/Data:   Vitals:   10/07/21 1018 10/07/21 1201  BP: (!) 104/59 102/66  Pulse: 82 (!) 58  Resp: 17 16  Temp: 98.1 F (36.7 C) 98 F (36.7 C)  SpO2: 99% 100%   No intake or output data in the 24 hours ending 10/07/21 1503    12/07/2019    1:45 AM 12/24/2018    6:02 PM 05/16/2018    1:41 AM  Last 3 Weights  Weight (lbs) 199 lb 15.3 oz 200 lb 195 lb  Weight (kg) 90.7 kg 90.719 kg 88.451 kg     There is no height or weight on file to calculate BMI.  Vitals:  Vitals:   10/07/21 1018 10/07/21 1201  BP: (!) 104/59 102/66  Pulse: 82 (!) 58  Resp: 17 16  Temp: 98.1 F (36.7 C) 98 F (36.7 C)  SpO2: 99% 100%   General Appearance: In no apparent distress, laying in bed HEENT: Normocephalic, atraumatic.  Neck: Supple,  trachea midline, no JVDs Cardiovascular: Regular rate and rhythm, normal S1-S2,  no murmur/rub/gallop Respiratory: Resting breathing unlabored, lungs sounds clear to auscultation bilaterally, no use of accessory muscles. On room air.  No wheezes, rales or rhonchi.   Gastrointestinal: Bowel sounds positive, abdomen soft.  Extremities: Able to move all extremities in bed without difficulty, no edema Musculoskeletal: Normal muscle bulk and tone Skin: Intact, warm, dry. No rashes or petechiae noted in exposed areas.  Neurologic: Alert, oriented to person, place and time. Fluent speech, no facial droop, no cognitive deficit Psychiatric: Normal affect. Mood is appropriate.    EKG:  The EKG was personally reviewed and demonstrates: Sinus bradycardia, no acute ST-T change   Relevant CV Studies: No previous work-up available  Laboratory Data:  High Sensitivity Troponin:   Recent Labs  Lab 09/19/21 1706 10/07/21 1046 10/07/21 1305  TROPONINIHS 3 11 29*     Chemistry Recent Labs  Lab 10/07/21 1046  NA 139  K 4.4  CL 107  CO2 26  GLUCOSE 109*  BUN 13  CREATININE 1.00  CALCIUM 9.0  GFRNONAA >60  ANIONGAP 6    No results for input(s): PROT, ALBUMIN, AST, ALT, ALKPHOS, BILITOT in the last 168 hours. Lipids No results for input(s): CHOL, TRIG, HDL, LABVLDL, LDLCALC, CHOLHDL in the last 168 hours.  Hematology Recent Labs  Lab 10/07/21 1046  WBC 7.9  RBC 4.80  HGB 14.6  HCT 43.9  MCV 91.5  MCH 30.4  MCHC 33.3  RDW 14.7  PLT 286   Thyroid No results for input(s): TSH, FREET4 in the last 168 hours.  BNPNo results for input(s): BNP, PROBNP in the last 168 hours.  DDimer No results for input(s): DDIMER in the last 168 hours.   Radiology/Studies:  DG Chest 2 View  Result Date: 10/07/2021 CLINICAL DATA:  Chest pain EXAM: CHEST - 2 VIEW COMPARISON:  09/19/2021 FINDINGS: Cardiac size is within normal limits. There are no signs of pulmonary edema or focal pulmonary  consolidation. Small linear density at the left cardiophrenic angle may suggest minimal scarring. There is no pleural effusion or pneumothorax. IMPRESSION: No active cardiopulmonary disease. Electronically Signed   By: Ernie Avena M.D.   On: 10/07/2021 11:10     Assessment and Plan:   Chest pain -Chest pain occurred after exertion, relieved after resting -Hs trop 11 >29>30 -EKG without acute ischemic change -Patient reports cardiac catheterization 45-month ago at [place] revealed "no blockage ", attempted calling [facility], unable to get medical records at this time.  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  Agree with above documentation.  Mr. Carrington is a 41 year old male with no significant past medical history who presented with chest pain.  He reports that he was visiting [place] 2 months ago and developed chest pain and had a troponin elevation.  He underwent a cardiac catheterization where he was told there were no blockages and no stents were placed.  Unclear if had nonobstructive CAD, as he was discharged on aspirin 81 mg and atorvastatin 80 mg daily.  He reports today he had another episode of chest pain.  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  Reports pain was intense and lasted for 15 to 20 minutes.  Pain resolved without intervention.  Given symptoms, he came to the ED.  Initial vital signs  notable for pulse 82, BP 104/59, SPO2 99% on room air.  Labs notable for creatinine 1.0, troponin 11 > 29 > 30, hemoglobin 14.6, platelets 286.  Chest x-ray unremarkable.  EKG showed sinus bradycardia, rate 55, right axis deviation, inferior Q waves, nonspecific T wave flattening.  On exam, patient is alert and oriented, regular rate and rhythm, no murmurs, lungs CTAB, no LE edema or JVD.  For his chest pain, unclear etiology.  We were unable to obtain records from his previous work-up in [place], but given he was discharged on aspirin/statin, suspect he had nonobstructive CAD on his cath.  He had mild troponin elevation today.  Recommend coronary CTA for further evaluation.  Coronary CTA showed nonobstructive CAD with noncalcified plaque causing minimal stenosis in ostial RCA; calcium score was 0.  There was some chamber dilatation (mild dilatation of LV, RV, LA), which could be due to athletic conditioning but recommend echocardiogram as outpatient for further evaluation.  We will schedule outpatient follow-up in cardiology clinic.  Little Ishikawa, MD

## 2021-10-07 NOTE — ED Provider Notes (Signed)
Care assumed from [facility], [place], at shift change, please see their notes for full documentation of patient's complaint/HPI. Briefly, pt here with chest pain that occurred today with radiation into L shoulder. Results so far show initial trop of 11 and repeat of 29. Hx of cath in the past in [place]. Cardiology has evaluated patient and recommends 3rd troponin. Awaiting troponin at this time. Plan is to follow up on tropon - if stable/downtrending can be discharged home and if elevated will plan for coronary CT in the ED.   Physical Exam  BP 117/74   Pulse (!) 48   Temp 98.1 F (36.7 C) (Oral)   Resp 16   SpO2 97%   Physical Exam Vitals and nursing note reviewed.  Constitutional:      Appearance: He is not ill-appearing.  HENT:     Head: Normocephalic and atraumatic.  Eyes:     Conjunctiva/sclera: Conjunctivae normal.  Cardiovascular:     Rate and Rhythm: Normal rate and regular rhythm.  Pulmonary:     Effort: Pulmonary effort is normal.     Breath sounds: Normal breath sounds.  Skin:    General: Skin is warm and dry.     Coloration: Skin is not jaundiced.  Neurological:     Mental Status: He is alert.    Procedures  Procedures  ED Course / MDM    Medical Decision Making Repeat troponin of 30.  Dr. Gardiner Rhyme with cardiology has ordered coronary CT and will follow along.  Coronary CT: IMPRESSION:  1.  Coronary calcium score of 0.   2.  Normal coronary origin with right dominance.   3. Nonobstructive CAD with noncalcified plaque causing minimal  (0-24%) stenosis in ostial RCA   CAD-RADS 1. Minimal non-obstructive CAD (0-24%). Consider  non-atherosclerotic causes of chest pain. Consider preventive  therapy and risk factor modification.   Dr. Gardiner Rhyme has evaluated coronary CT. Feels comfortable discharging patient at this time. The clinic will contact patient for follow up. Recommends continuation of aspirin and statin that he has been prescribed in [place]. Stable  for discharge otherwise.   Amount and/or Complexity of Data Reviewed Labs: ordered. Radiology: ordered.          Eustaquio Maize, PA-C 10/07/21 1811    Wyvonnia Dusky, MD 10/07/21 2113

## 2021-10-07 NOTE — ED Provider Notes (Signed)
[facility] EMERGENCY DEPARTMENT Provider Note   CSN: [id number] Arrival date & time: 10/07/21  1009     History  Chief Complaint  Patient presents with   Chest Pain    Edward Schroeder is a 41 y.o. male.  Patient presents the emergency department for evaluation of chest pain.  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  Patient states that he developed pain in the mid chest which then radiated to the left shoulder.  He went and lay down in the hallway and symptoms resolved after 15 or 20 minutes.  EMS was called and patient was transported to the hospital.  Patient states that he was visiting [place] about 2 months ago and was admitted to the hospital overnight for chest pain.  He states that his cardiac enzymes went from 150-300 while there and he had a catheterization.  He did not have any stents and was not told that he had any blockages.  He cannot tell me exactly where he was admitted.  Denies history of hypertension, high cholesterol or diabetes.  He does smoke cigarettes.  He denies cocaine or other stimulant use.  Denies family history of cardiac disease.  Patient states that he took aspirin after the pain began.  Patient presented to the emergency room 09/19/2021 for chest pain.  He had labs ordered but it appears that he left prior to being evaluated.  Troponin at that time was negative x1.    Home Medications Prior to Admission medications   Medication Sig Start Date End Date Taking? Authorizing Provider  albuterol (VENTOLIN HFA) 108 (90 Base) MCG/ACT inhaler Inhale 1-2 puffs into the lungs every 6 (six) hours as needed for wheezing or shortness of breath. 03/13/19   Domenick Gong, MD  benzonatate (TESSALON) 200 MG capsule Take 1 capsule (200 mg total) by mouth 3 (three) times daily as needed for cough. 03/13/19   Domenick Gong, MD  Chlorphen-PE-Acetaminophen (ROBITUSSIN COLD/CONGESTION PO) Take by  mouth.    [provider]  Phenyleph-CPM-DM-APAP (ALKA-SELTZER PLUS COLD & COUGH) 09-19-08-325 MG CAPS Take by mouth.    [provider]  Pseudoeph-Doxylamine-DM-APAP (NYQUIL PO) Take by mouth.    [provider]  Spacer/Aero-Holding Chambers (AEROCHAMBER PLUS) inhaler Use as instructed 03/13/19   Domenick Gong, MD  traMADol (ULTRAM) 50 MG tablet Take 1 tablet (50 mg total) by mouth every 6 (six) hours as needed. 04/10/20   Eustace Moore, MD      Allergies    Shellfish allergy    Review of Systems   Review of Systems  Physical Exam Updated Vital Signs BP (!) 104/59 (BP Location: Right Arm)   Pulse 82   Temp 98.1 F (36.7 C)   SpO2 99%  Physical Exam Vitals and nursing note reviewed.  Constitutional:      Appearance: He is well-developed. He is not diaphoretic.  HENT:     Head: Normocephalic and atraumatic.     Mouth/Throat:     Mouth: Mucous membranes are not dry.  Eyes:     Conjunctiva/sclera: Conjunctivae normal.  Neck:     Vascular: Normal carotid pulses. No carotid bruit or JVD.     Trachea: Trachea normal. No tracheal deviation.  Cardiovascular:     Rate and Rhythm: Normal rate and regular rhythm.     Pulses: No decreased pulses.          Radial pulses are 2+ on the right  side and 2+ on the left side.     Heart sounds: Normal heart sounds, S1 normal and S2 normal. Heart sounds not distant. No murmur heard. Pulmonary:     Effort: Pulmonary effort is normal. No respiratory distress.     Breath sounds: Normal breath sounds. No wheezing, rhonchi or rales.  Chest:     Chest wall: No tenderness.  Abdominal:     General: Bowel sounds are normal.     Palpations: Abdomen is soft.     Tenderness: There is no abdominal tenderness. There is no guarding or rebound.  Musculoskeletal:     Cervical back: Normal range of motion and neck supple. No muscular tenderness.     Right lower leg: No edema.     Left lower leg: No edema.  Skin:     General: Skin is warm and dry.     Coloration: Skin is not pale.  Neurological:     Mental Status: He is alert. Mental status is at baseline.  Psychiatric:        Mood and Affect: Mood normal.    ED Results / Procedures / Treatments   Labs (all labs ordered are listed, but only abnormal results are displayed) Labs Reviewed  BASIC METABOLIC PANEL - Abnormal; Notable for the following components:      Result Value   Glucose, Bld 109 (*)    All other components within normal limits  CBG MONITORING, ED - Abnormal; Notable for the following components:   Glucose-Capillary 133 (*)    All other components within normal limits  TROPONIN I (HIGH SENSITIVITY) - Abnormal; Notable for the following components:   Troponin I (High Sensitivity) 29 (*)    All other components within normal limits  CBC  TROPONIN I (HIGH SENSITIVITY)  TROPONIN I (HIGH SENSITIVITY)    ED ECG REPORT   Date: 10/07/2021  Rate: 55  Rhythm: sinus bradycardia  QRS Axis: right  Intervals: normal  ST/T Wave abnormalities: normal  Conduction Disutrbances:none  Narrative Interpretation:   Old EKG Reviewed: unchanged from 09/19/2021  I have personally reviewed the EKG tracing and agree with the computerized printout as noted.   Radiology DG Chest 2 View  Result Date: 10/07/2021 CLINICAL DATA:  Chest pain EXAM: CHEST - 2 VIEW COMPARISON:  09/19/2021 FINDINGS: Cardiac size is within normal limits. There are no signs of pulmonary edema or focal pulmonary consolidation. Small linear density at the left cardiophrenic angle may suggest minimal scarring. There is no pleural effusion or pneumothorax. IMPRESSION: No active cardiopulmonary disease. Electronically Signed   By: Elmer Picker M.D.   On: 10/07/2021 11:10    Procedures Procedures    Medications Ordered in ED Medications - No data to display  ED Course/ Medical Decision Making/ A&P    Patient seen and examined. History obtained directly from patient.   Unable to pull up other hospitalization notes from patient's recent admission and he does not know where he was admitted, other than it was in [place].  Labs/EKG: EKG reviewed.  Ordered CBC, BMP, troponin.  Imaging: Ordered chest x-ray.  Medications/Fluids: None ordered  Most recent vital signs reviewed and are as follows: BP (!) 104/59 (BP Location: Right Arm)   Pulse 82   Temp 98.1 F (36.7 C)   SpO2 99%   Initial impression: Chest pain  2:26 PM Reassessment performed. Patient has been stable during rechecks.  No recurrence of chest pain.  States he is hungry and anxious to go. Offered sandwich but  states he does not eat bread. I have encouraged him to stay for cardiology consult.   Labs personally reviewed and interpreted including: CBC unremarkable, BMP mild hyperglycemia at 109 otherwise unremarkable; troponin 11 >> 29.   Imaging personally visualized and interpreted including:   Reviewed pertinent lab work and imaging with patient at bedside. Questions answered.   Most current vital signs reviewed and are as follows: BP 102/66   Pulse (!) 58   Temp 98 F (36.7 C)   Resp 16   SpO2 100%   Plan: Consulted with cardiology, Dr. Gardiner Rhyme, discussed work-up and findings to this point.  They will come see the patient to formulate plan.  Patient was able to find some records that he had at home and was seen at [facility].  He has some results of blood testing but no cath report.  I am unable to pull up records in care everywhere from this facility.  2:50 PM Cardiology at bedside.   3:30 PM Spoke with Dr. Gardiner Rhyme.  Plan to check third troponin.  Depending on results, may need coronary CT or admission for cath.  Discussed with Thera Flake at shift change he will follow-up on results.                            Medical Decision Making Amount and/or Complexity of Data Reviewed Labs: ordered. Radiology: ordered.   Patient with chest pain,  marginally elevated troponin rise.  Awaiting completion of work-up.  For this patient's complaint of chest pain, the following emergent conditions were considered on the differential diagnosis: acute coronary syndrome, pulmonary embolism, pneumothorax, myocarditis, pericardial tamponade, aortic dissection, thoracic aortic aneurysm complication, esophageal perforation.   Other causes were also considered including: gastroesophageal reflux disease, musculoskeletal pain including costochondritis, pneumonia/pleurisy, herpes zoster, pericarditis.  In regards to possibility of PE, symptoms are atypical for PE and risk profile is low, making PE low likelihood.         Final Clinical Impression(s) / ED Diagnoses Final diagnoses:  Precordial pain  Elevated troponin    Rx / DC Orders ED Discharge Orders     None         Carlisle Cater, PA-C 10/07/21 1532    Blanchie Dessert, MD 10/08/21 [phone]

## 2021-10-07 NOTE — Discharge Instructions (Signed)
The cardiology clinic will call you to schedule an appointment for further evaluation of your ongoing chest pain. Continue taking your medications as prescribed.   Return to the ED for any new/worsening symptoms

## 2021-10-09 ENCOUNTER — Telehealth: Payer: Self-pay | Admitting: *Deleted

## 2021-10-09 NOTE — Telephone Encounter (Signed)
Left message to call back to schedule follow up with Dr. Bjorn Pippin

## 2021-10-18 NOTE — Telephone Encounter (Signed)
Left message to call back  

## 2021-10-25 NOTE — Telephone Encounter (Signed)
Left message to call back  Unable to reach x 3-will mail letter.

## 2022-03-17 ENCOUNTER — Other Ambulatory Visit: Payer: Self-pay

## 2022-03-17 ENCOUNTER — Emergency Department (HOSPITAL_COMMUNITY)

## 2022-03-17 ENCOUNTER — Inpatient Hospital Stay (HOSPITAL_COMMUNITY)
Admission: EM | Admit: 2022-03-17 | Discharge: 2022-03-18 | DRG: 313 | Attending: Internal Medicine | Admitting: Internal Medicine

## 2022-03-17 DIAGNOSIS — I2510 Atherosclerotic heart disease of native coronary artery without angina pectoris: Secondary | ICD-10-CM | POA: Diagnosis present

## 2022-03-17 DIAGNOSIS — Z91013 Allergy to seafood: Secondary | ICD-10-CM

## 2022-03-17 DIAGNOSIS — R7989 Other specified abnormal findings of blood chemistry: Secondary | ICD-10-CM

## 2022-03-17 DIAGNOSIS — R001 Bradycardia, unspecified: Secondary | ICD-10-CM | POA: Diagnosis present

## 2022-03-17 DIAGNOSIS — R0789 Other chest pain: Principal | ICD-10-CM | POA: Diagnosis present

## 2022-03-17 DIAGNOSIS — R079 Chest pain, unspecified: Secondary | ICD-10-CM | POA: Diagnosis present

## 2022-03-17 DIAGNOSIS — Z79899 Other long term (current) drug therapy: Secondary | ICD-10-CM

## 2022-03-17 DIAGNOSIS — E785 Hyperlipidemia, unspecified: Secondary | ICD-10-CM | POA: Diagnosis present

## 2022-03-17 DIAGNOSIS — F17290 Nicotine dependence, other tobacco product, uncomplicated: Secondary | ICD-10-CM | POA: Diagnosis present

## 2022-03-17 DIAGNOSIS — I252 Old myocardial infarction: Secondary | ICD-10-CM

## 2022-03-17 DIAGNOSIS — Z91014 Allergy to mammalian meats: Secondary | ICD-10-CM

## 2022-03-17 NOTE — ED Triage Notes (Signed)
Patient BIB EMS for evaluation of chest pain.  Reports pain started suddenly.  Does not radiate.  Hx of NSTEMI.  Was given ASA 325 mg PO and Nitro SL x 1 PTA with improvement in pain

## 2022-03-18 ENCOUNTER — Inpatient Hospital Stay (HOSPITAL_COMMUNITY): Payer: 59

## 2022-03-18 DIAGNOSIS — E785 Hyperlipidemia, unspecified: Secondary | ICD-10-CM | POA: Diagnosis present

## 2022-03-18 DIAGNOSIS — F17290 Nicotine dependence, other tobacco product, uncomplicated: Secondary | ICD-10-CM | POA: Diagnosis present

## 2022-03-18 DIAGNOSIS — R0789 Other chest pain: Secondary | ICD-10-CM | POA: Diagnosis present

## 2022-03-18 DIAGNOSIS — I2510 Atherosclerotic heart disease of native coronary artery without angina pectoris: Secondary | ICD-10-CM | POA: Diagnosis present

## 2022-03-18 DIAGNOSIS — Z91014 Allergy to mammalian meats: Secondary | ICD-10-CM | POA: Diagnosis not present

## 2022-03-18 DIAGNOSIS — I252 Old myocardial infarction: Secondary | ICD-10-CM | POA: Diagnosis not present

## 2022-03-18 DIAGNOSIS — R079 Chest pain, unspecified: Secondary | ICD-10-CM

## 2022-03-18 DIAGNOSIS — R001 Bradycardia, unspecified: Secondary | ICD-10-CM | POA: Diagnosis present

## 2022-03-18 DIAGNOSIS — Z79899 Other long term (current) drug therapy: Secondary | ICD-10-CM | POA: Diagnosis not present

## 2022-03-18 DIAGNOSIS — Z91013 Allergy to seafood: Secondary | ICD-10-CM | POA: Diagnosis not present

## 2022-03-18 LAB — CBC WITH DIFFERENTIAL/PLATELET
Abs Immature Granulocytes: 0.02 10*3/uL (ref 0.00–0.07)
Basophils Absolute: 0.1 10*3/uL (ref 0.0–0.1)
Basophils Relative: 1 %
Eosinophils Absolute: 0.5 10*3/uL (ref 0.0–0.5)
Eosinophils Relative: 5 %
HCT: 42.9 % (ref 39.0–52.0)
Hemoglobin: 14.5 g/dL (ref 13.0–17.0)
Immature Granulocytes: 0 %
Lymphocytes Relative: 26 %
Lymphs Abs: 2.7 10*3/uL (ref 0.7–4.0)
MCH: 30.3 pg (ref 26.0–34.0)
MCHC: 33.8 g/dL (ref 30.0–36.0)
MCV: 89.6 fL (ref 80.0–100.0)
Monocytes Absolute: 0.9 10*3/uL (ref 0.1–1.0)
Monocytes Relative: 8 %
Neutro Abs: 6.5 10*3/uL (ref 1.7–7.7)
Neutrophils Relative %: 60 %
Platelets: 243 10*3/uL (ref 150–400)
RBC: 4.79 MIL/uL (ref 4.22–5.81)
RDW: 13 % (ref 11.5–15.5)
WBC: 10.6 10*3/uL — ABNORMAL HIGH (ref 4.0–10.5)
nRBC: 0 % (ref 0.0–0.2)

## 2022-03-18 LAB — LIPID PANEL
Cholesterol: 91 mg/dL (ref 0–200)
HDL: 30 mg/dL — ABNORMAL LOW (ref >40)
LDL Cholesterol: 52 mg/dL (ref 0–99)
Total CHOL/HDL Ratio: 3 RATIO
Triglycerides: 46 mg/dL (ref <150)
VLDL: 9 mg/dL (ref 0–40)

## 2022-03-18 LAB — ECHOCARDIOGRAM COMPLETE
AR max vel: 3.52 cm2
AV Area VTI: 3.34 cm2
AV Area mean vel: 3.24 cm2
AV Mean grad: 3 mmHg
AV Peak grad: 6.1 mmHg
Ao pk vel: 1.23 m/s
Area-P 1/2: 3.77 cm2
S' Lateral: 3.7 cm

## 2022-03-18 LAB — I-STAT CHEM 8, ED
BUN: 7 mg/dL (ref 6–20)
Calcium, Ion: 1.21 mmol/L (ref 1.15–1.40)
Chloride: 104 mmol/L (ref 98–111)
Creatinine, Ser: 0.8 mg/dL (ref 0.61–1.24)
Glucose, Bld: 97 mg/dL (ref 70–99)
HCT: 45 % (ref 39.0–52.0)
Hemoglobin: 15.3 g/dL (ref 13.0–17.0)
Potassium: 3.8 mmol/L (ref 3.5–5.1)
Sodium: 143 mmol/L (ref 135–145)
TCO2: 25 mmol/L (ref 22–32)

## 2022-03-18 LAB — TROPONIN I (HIGH SENSITIVITY)
Troponin I (High Sensitivity): 14 ng/L (ref <18)
Troponin I (High Sensitivity): 39 ng/L — ABNORMAL HIGH (ref <18)
Troponin I (High Sensitivity): 42 ng/L — ABNORMAL HIGH (ref <18)

## 2022-03-18 LAB — HIV ANTIBODY (ROUTINE TESTING W REFLEX): HIV Screen 4th Generation wRfx: NONREACTIVE

## 2022-03-18 MED ORDER — ALUM & MAG HYDROXIDE-SIMETH 200-200-20 MG/5ML PO SUSP
30.0000 mL | Freq: Once | ORAL | Status: AC
  Administered 2022-03-18: 30 mL via ORAL
  Filled 2022-03-18: qty 30

## 2022-03-18 MED ORDER — ONDANSETRON HCL 4 MG/2ML IJ SOLN: 4.0000 mg | Freq: Four times a day (QID) | INTRAMUSCULAR | Status: DC | PRN

## 2022-03-18 MED ORDER — SODIUM CHLORIDE 0.9 % IV SOLN: INTRAVENOUS | Status: DC

## 2022-03-18 MED ORDER — ASPIRIN 81 MG PO TBEC
81.0000 mg | DELAYED_RELEASE_TABLET | Freq: Every day | ORAL | Status: DC
  Administered 2022-03-18: 81 mg via ORAL
  Filled 2022-03-18: qty 1

## 2022-03-18 MED ORDER — ACETAMINOPHEN 325 MG PO TABS: 650.0000 mg | ORAL_TABLET | ORAL | Status: DC | PRN

## 2022-03-18 MED ORDER — ATORVASTATIN CALCIUM 40 MG PO TABS: 80.0000 mg | ORAL_TABLET | Freq: Every day | ORAL | Status: DC

## 2022-03-18 NOTE — ED Provider Notes (Addendum)
[facility] EMERGENCY DEPARTMENT Provider Note   CSN: [id number] Arrival date & time: 03/17/22  2336     History  Chief Complaint  Patient presents with   Chest Pain    Edward Schroeder is a 41 y.o. male.  The history is provided by the patient and the police.  Chest Pain Pain location:  Substernal area Pain quality: dull and sharp   Pain radiates to:  Does not radiate Pain severity:  Moderate Onset quality:  Sudden Timing:  Constant Progression:  Resolved Chronicity:  Recurrent Context: not eating and not movement   Relieved by:  Nothing Worsened by:  Nothing Ineffective treatments: resolved with NTG and ASA PTA. Associated symptoms: no fever and no palpitations   Risk factors: male sex      No past medical history on file.   Home Medications Prior to Admission medications   Medication Sig Start Date End Date Taking? Authorizing Provider  albuterol (VENTOLIN HFA) 108 (90 Base) MCG/ACT inhaler Inhale 1-2 puffs into the lungs every 6 (six) hours as needed for wheezing or shortness of breath. 03/13/19   Domenick Gong, MD  benzonatate (TESSALON) 200 MG capsule Take 1 capsule (200 mg total) by mouth 3 (three) times daily as needed for cough. 03/13/19   Domenick Gong, MD  Chlorphen-PE-Acetaminophen (ROBITUSSIN COLD/CONGESTION PO) Take by mouth.    [provider]  Phenyleph-CPM-DM-APAP (ALKA-SELTZER PLUS COLD & COUGH) 09-19-08-325 MG CAPS Take by mouth.    [provider]  Pseudoeph-Doxylamine-DM-APAP (NYQUIL PO) Take by mouth.    [provider]  Spacer/Aero-Holding Chambers (AEROCHAMBER PLUS) inhaler Use as instructed 03/13/19   Domenick Gong, MD  traMADol (ULTRAM) 50 MG tablet Take 1 tablet (50 mg total) by mouth every 6 (six) hours as needed. 04/10/20   Eustace Moore, MD      Allergies    Shellfish allergy    Review of Systems   Review of Systems  Constitutional:  Negative for fever.  HENT:  Negative for  facial swelling.   Eyes:  Negative for redness.  Respiratory:  Negative for wheezing and stridor.   Cardiovascular:  Positive for chest pain. Negative for palpitations and leg swelling.  All other systems reviewed and are negative.   Physical Exam Updated Vital Signs BP 111/79   Pulse (!) 50   Temp 98.2 F (36.8 C) (Oral)   Resp 19   SpO2 98%  Physical Exam Vitals and nursing note reviewed.  Constitutional:      General: He is not in acute distress.    Appearance: Normal appearance. He is well-developed. He is not diaphoretic.  HENT:     Head: Normocephalic and atraumatic.     Nose: Nose normal.  Eyes:     Conjunctiva/sclera: Conjunctivae normal.     Pupils: Pupils are equal, round, and reactive to light.  Cardiovascular:     Rate and Rhythm: Normal rate and regular rhythm.  Pulmonary:     Effort: Pulmonary effort is normal.     Breath sounds: Normal breath sounds. No wheezing or rales.  Abdominal:     General: Bowel sounds are normal.     Palpations: Abdomen is soft.     Tenderness: There is no abdominal tenderness. There is no guarding or rebound.  Musculoskeletal:        General: Normal range of motion.     Cervical back: Normal range of motion and neck supple.  Skin:    General: Skin is warm and dry.  Capillary Refill: Capillary refill takes less than 2 seconds.  Neurological:     General: No focal deficit present.     Mental Status: He is alert and oriented to person, place, and time.  Psychiatric:        Mood and Affect: Mood normal.        Behavior: Behavior normal.     ED Results / Procedures / Treatments   Labs (all labs ordered are listed, but only abnormal results are displayed) Results for orders placed or performed during the hospital encounter of 03/17/22  CBC with Differential  Result Value Ref Range   WBC 10.6 (H) 4.0 - 10.5 K/uL   RBC 4.79 4.22 - 5.81 MIL/uL   Hemoglobin 14.5 13.0 - 17.0 g/dL   HCT 42.9 39.0 - 52.0 %   MCV 89.6 80.0 -  100.0 fL   MCH 30.3 26.0 - 34.0 pg   MCHC 33.8 30.0 - 36.0 g/dL   RDW 13.0 11.5 - 15.5 %   Platelets 243 150 - 400 K/uL   nRBC 0.0 0.0 - 0.2 %   Neutrophils Relative % 60 %   Neutro Abs 6.5 1.7 - 7.7 K/uL   Lymphocytes Relative 26 %   Lymphs Abs 2.7 0.7 - 4.0 K/uL   Monocytes Relative 8 %   Monocytes Absolute 0.9 0.1 - 1.0 K/uL   Eosinophils Relative 5 %   Eosinophils Absolute 0.5 0.0 - 0.5 K/uL   Basophils Relative 1 %   Basophils Absolute 0.1 0.0 - 0.1 K/uL   Immature Granulocytes 0 %   Abs Immature Granulocytes 0.02 0.00 - 0.07 K/uL  I-stat chem 8, ED (not at [facility] or [facility])  Result Value Ref Range   Sodium 143 135 - 145 mmol/L   Potassium 3.8 3.5 - 5.1 mmol/L   Chloride 104 98 - 111 mmol/L   BUN 7 6 - 20 mg/dL   Creatinine, Ser 0.80 0.61 - 1.24 mg/dL   Glucose, Bld 97 70 - 99 mg/dL   Calcium, Ion 1.21 1.15 - 1.40 mmol/L   TCO2 25 22 - 32 mmol/L   Hemoglobin 15.3 13.0 - 17.0 g/dL   HCT 45.0 39.0 - 52.0 %  Troponin I (High Sensitivity)  Result Value Ref Range   Troponin I (High Sensitivity) 14 <18 ng/L  Troponin I (High Sensitivity)  Result Value Ref Range   Troponin I (High Sensitivity) 42 (H) <18 ng/L   DG Chest Portable 1 View  Result Date: 03/18/2022 CLINICAL DATA:  Pain EXAM: PORTABLE CHEST 1 VIEW COMPARISON:  10/07/2021 FINDINGS: The heart size and mediastinal contours are within normal limits. Both lungs are clear. The visualized skeletal structures are unremarkable. IMPRESSION: Normal study. Electronically Signed   By: Rolm Baptise M.D.   On: 03/18/2022 00:27    EKG  EKG Interpretation  Date/Time:  Saturday March 17 2022 23:52:17 EDT Ventricular Rate:  52 PR Interval:  172 QRS Duration: 91 QT Interval:  423 QTC Calculation: 394 R Axis:   90 Text Interpretation: Sinus rhythm Borderline right axis deviation Confirmed by Randal Buba, Elfreida Heggs ([id number]) on 03/18/2022 4:10:03 AM        Radiology No results found.  Procedures Procedures    Medications  Ordered in ED Medications  alum & mag hydroxide-simeth (MAALOX/MYLANTA) 200-200-20 MG/5ML suspension 30 mL (has no administration in time range)    ED Course/ Medical Decision Making/ A&P  Medical Decision Making Patient with SSCP prior to arrival that resolved with ASA and SL NTG.  Has happened before has a follow up with cardiology  Amount and/or Complexity of Data Reviewed Independent Historian:     Details: Police  External Data Reviewed: notes.    Details: Previous notes reviewed  Labs: ordered.    Details: All labs reviewed: first troponin 14, positive delta second troponin 42 and elevated.  White count slight elevation 10.6, normal hemoglobin 14.5, normal platelet.  Normal sodium 143, normal potassium 3.8,  normal creatinine .8  Radiology: ordered and independent interpretation performed.    Details: Normal by me  ECG/medicine tests: ordered and independent interpretation performed. Decision-making details documented in ED Course.  Risk OTC drugs. Risk Details: Will admit for pain with positive delta troponin.      Final Clinical Impression(s) / ED Diagnoses Final diagnoses:  None   The patient appears reasonably stabilized for admission considering the current resources, flow, and capabilities available in the ED at this time, and I doubt any other [facility] requiring further screening and/or treatment in the ED prior to admission.     Christabel Camire, MD 03/18/22 1761

## 2022-03-18 NOTE — Consult Note (Signed)
CARDIOLOGY CONSULT NOTE       Patient ID: Edward Schroeder MRN: [id number] DOB/AGE: 39-08-1980 39 y.o.  Admit date: 03/17/2022 Referring Physician: Bonner Puna Primary Physician: [facility] [place] Primary Cardiologist: Gardiner Rhyme Reason for Consultation: Chest Pain  Principal Problem:   Chest pain   HPI:  41 y.o. admitted by hospitalist for chest pain from [facility] jail. Seen by Dr Gardiner Rhyme 10/07/21  Had cath earlier in year at [place] with no blockages Dunlevy cigars drinks ETOH denies other drugs In may he had troponin max 29 no acute ECG changes Cardiac CTA 10/10/21 with calcium score 0 1-24% soft plaque in ostial RCA Sharp left sided chest pain radiates to left arm with diaphoresis and dizziness Improved with ASA/nitro Active with occasional exertional pain CXR NAD normal mediastinum Aortal normal on CTA in May Troponin 14->42-> 39.    ECG non acute SR rate 52 insignificant Q waves 3,F no evidence of pericarditis  Pain free now Pending TTE   ROS All other systems reviewed and negative except as noted above  No past medical history on file.  Family History  Problem Relation Age of Onset   Healthy Mother    Healthy Father     Social History   Socioeconomic History   Marital status: Married    Spouse name: Not on file   Number of children: Not on file   Years of education: Not on file   Highest education level: Not on file  Occupational History   Not on file  Tobacco Use   Smoking status: Every Day    Packs/day: 2.00    Types: Cigars, Cigarettes   Smokeless tobacco: Never  Vaping Use   Vaping Use: Never used  Substance and Sexual Activity   Alcohol use: Yes    Comment: occ   Drug use: Yes    Types: Marijuana   Sexual activity: Not on file  Other Topics Concern   Not on file  Social History Narrative   Not on file   Social Determinants of Health   Financial Resource Strain: Not on file  Food Insecurity: Not on file   Transportation Needs: Not on file  Physical Activity: Not on file  Stress: Not on file  Social Connections: Not on file  Intimate Partner Violence: Not on file    Past Surgical History:  Procedure Laterality Date   I & D EXTREMITY Left 08/20/2015   Procedure: IRRIGATION AND DEBRIDEMENT OF FIRST SECOND AND THIRD TOE FRACTURE;  Surgeon: Wylene Simmer, MD;  Location: [facility];  Service: Orthopedics;  Laterality: Left;      Current Facility-Administered Medications:    0.9 %  sodium chloride infusion, , Intravenous, Continuous, Clance Boll, MD, Last Rate: 75 mL/hr at 03/18/22 0629, New Bag at 03/18/22 6283   acetaminophen (TYLENOL) tablet 650 mg, 650 mg, Oral, Q4H PRN, Clance Boll, MD   aspirin EC tablet 81 mg, 81 mg, Oral, Daily, Myles Rosenthal A, MD   atorvastatin (LIPITOR) tablet 80 mg, 80 mg, Oral, QHS, Myles Rosenthal A, MD   ondansetron [facility]) injection 4 mg, 4 mg, Intravenous, Q6H PRN, Clance Boll, MD  Current Outpatient Medications:    ASPIRIN LOW DOSE 81 MG tablet, Take 81 mg by mouth daily., Disp: , Rfl:    atorvastatin (LIPITOR) 80 MG tablet, Take 80 mg by mouth at bedtime., Disp: , Rfl:   aspirin EC  81 mg Oral Daily   atorvastatin  80 mg Oral QHS  sodium chloride 75 mL/hr at 03/18/22 [id number]    Physical Exam: Blood pressure 114/85, pulse (!) 43, temperature 97.7 F (36.5 C), temperature source Oral, resp. rate 14, SpO2 97 %.    Affect appropriate Black male  HEENT: normal Neck supple with no adenopathy JVP normal no bruits no thyromegaly Lungs clear with no wheezing and good diaphragmatic motion Heart:  S1/S2 no murmur, no rub, gallop or click PMI normal Abdomen: benighn, BS positve, no tenderness, no AAA no bruit.  No HSM or HJR Distal pulses intact with no bruits No edema Neuro non-focal Skin warm and dry No muscular weakness   Labs:   Lab Results  Component Value Date   WBC 10.6 (H) 03/17/2022   HGB 15.3 03/18/2022   HCT  45.0 03/18/2022   MCV 89.6 03/17/2022   PLT 243 03/17/2022    Recent Labs  Lab 03/18/22 0011  NA 143  K 3.8  CL 104  BUN 7  CREATININE 0.80  GLUCOSE 97   Lab Results  Component Value Date   TROPONINI <0.03 02/05/2018    Lab Results  Component Value Date   CHOL 91 03/18/2022   Lab Results  Component Value Date   HDL 30 (L) 03/18/2022   Lab Results  Component Value Date   LDLCALC 52 03/18/2022   Lab Results  Component Value Date   TRIG 46 03/18/2022   Lab Results  Component Value Date   CHOLHDL 3.0 03/18/2022   No results found for: "LDLDIRECT"    Radiology: DG Chest Portable 1 View  Result Date: 03/18/2022 CLINICAL DATA:  Pain EXAM: PORTABLE CHEST 1 VIEW COMPARISON:  10/07/2021 FINDINGS: The heart size and mediastinal contours are within normal limits. Both lungs are clear. The visualized skeletal structures are unremarkable. IMPRESSION: Normal study. Electronically Signed   By: Rolm Baptise M.D.   On: 03/18/2022 00:27    EKG: See HPI   ASSESSMENT AND PLAN:   Chest Pain:  doubt acute coronary syndrome. ECG non acute, troponin low/flat. CXR NAD. CTA May with calcium score 0 1-24% non obstructive dx in ostial RCA CAD RADS 1. And cath earlier in year [place] no disease and no coronary anomaly. TTE pending r/o hypertrophic DCM pericardial abnormalities. Ok to d/c back to prison if TTE normal Continue ASA/ Statin    Signed: Jenkins Rouge 03/18/2022, 9:29 AM

## 2022-03-18 NOTE — Progress Notes (Incomplete)
Echocardiogram 2D Echocardiogram has been performed.  Edward Schroeder 03/18/2022, 11:20 AM

## 2022-03-18 NOTE — Progress Notes (Signed)
PROGRESS NOTE  Brief Narrative: Edward Schroeder is a 40 y.o. male with a history of HLD, current incarceration, admitted this morning for sharp left chest pain radiating with tingling to left arm occurring while sitting reading a book last night, improved with ASA + NTG that hasn't recurred. ECG sinus brady, trop 13 > 42 > 39. Had coronary CTA in May 2023 with minimal ostial RCA stenosis, calcium score zero.   Subjective: Chest pain has not recurred, would like to eat, no new complaints.   Objective: BP 114/85   Pulse (!) 43   Temp 97.7 F (36.5 C) (Oral)   Resp 14   SpO2 97%   Gen: No distress Pulm: Clear and nonlabored on room air  CV: Regular bradycardia 40's - 50's, no murmur, no JVD, no edema. No focal visible or palpable deformity of chest wall.  GI: Soft, NT, ND, +BS  Neuro: Alert and oriented. No focal deficits. Skin: No rashes, lesions or ulcers on visualized skin.   Assessment & Plan: Chest discomfort: Occurring at rest this episode but alleviated by ASA and NTG. With reassuring coronary evaluation earlier this year, nonischemic appearing ECG and very mild troponin changes, ACS is felt to be unlikely. MSK is certainly possible. - I appreciate cardiology evaluation. D/w Dr. Johnsie Cancel. Pt will be kept NPO for now. Echocardiogram is ordered by Dr. Marcello Moores.  - Continue aspirin.   Sinus bradycardia: Worse when sleeping, asymptomatic, no hypotension.  - Not on BB at this time.  Dyslipidemia: LDL 52, HDL 30.  - Continue atorvastatin 80mg   Patrecia Pour, MD Pager on amion 03/18/2022, 9:31 AM

## 2022-03-18 NOTE — H&P (Signed)
History and Physical    Edward Schroeder PJA:[id number] DOB: 11-29-1980 DOA: 03/17/2022  PCP: [facility]  Patient coming from: jail  I have personally briefly reviewed patient's old medical records in [facility] Link  Chief Complaint: chest pain   HPI: Edward Schroeder is a 41 y.o. male with medical history significant of  HLD,CAD NSTEMI 3/23 s/p cath noting nonobstructive CAD. Patient presents to ED from jail after acute onset of chest pain described as a sharp squeezing sensation on left chest with radiation to left arm. He notes he has associated symptoms of dizziness, as well as diaphoresis. He states this pain feels the same as his prior cardiac pain.In field patient was given asa and nitro with resolution of pain. ON arrival to ED patient chest pain free w/o recurrence of chest pain. Patient states he is very active and intermittently gets get pain with exercise.  ED Course:  Vitals afeb, HR 52, rr 13sat 99% on ra  BP: 118/78, hr 50, rr 22 Na 143, K 3.8, cr 0.80,  DG EKG:nsr  Labs: wbc 10.6, hgb 14.5, plt 243 Ce 14, 42   Tx gi cocktail   Review of Systems: As per HPI otherwise 10 point review of systems negative.   PMH  As noted above   Past Surgical History:  Procedure Laterality Date   I & D EXTREMITY Left 08/20/2015   Procedure: IRRIGATION AND DEBRIDEMENT OF FIRST SECOND AND THIRD TOE FRACTURE;  Surgeon: Toni Arthurs, MD;  Location: [facility] OR;  Service: Orthopedics;  Laterality: Left;     reports that he has been smoking cigars. He has been smoking an average of 2 packs per day. He has never used smokeless tobacco. He reports current alcohol use. He reports current drug use. Drug: Marijuana.  Allergies  Allergen Reactions   Shellfish Allergy Hives    Family History  Problem Relation Age of Onset   Healthy Mother    Healthy Father     Prior to Admission medications   Medication Sig Start Date End Date Taking? Authorizing Provider  albuterol (VENTOLIN HFA)  108 (90 Base) MCG/ACT inhaler Inhale 1-2 puffs into the lungs every 6 (six) hours as needed for wheezing or shortness of breath. 03/13/19   Domenick Gong, MD  benzonatate (TESSALON) 200 MG capsule Take 1 capsule (200 mg total) by mouth 3 (three) times daily as needed for cough. 03/13/19   Domenick Gong, MD  Chlorphen-PE-Acetaminophen (ROBITUSSIN COLD/CONGESTION PO) Take by mouth.    [provider]  Phenyleph-CPM-DM-APAP (ALKA-SELTZER PLUS COLD & COUGH) 09-19-08-325 MG CAPS Take by mouth.    [provider]  Pseudoeph-Doxylamine-DM-APAP (NYQUIL PO) Take by mouth.    [provider]  Spacer/Aero-Holding Chambers (AEROCHAMBER PLUS) inhaler Use as instructed 03/13/19   Domenick Gong, MD  traMADol (ULTRAM) 50 MG tablet Take 1 tablet (50 mg total) by mouth every 6 (six) hours as needed. 04/10/20   Eustace Moore, MD    Physical Exam: Vitals:   03/18/22 0145 03/18/22 0250 03/18/22 0300 03/18/22 0340  BP: (!) 131/99 119/87 116/80   Pulse: (!) 53 (!) 54 (!) 49   Resp: 12 12 19    Temp:    97.8 F (36.6 C)  TempSrc:    Oral  SpO2: 98% 97% 96%     Constitutional: NAD, calm, comfortable Vitals:   03/18/22 0145 03/18/22 0250 03/18/22 0300 03/18/22 0340  BP: (!) 131/99 119/87 116/80   Pulse: (!) 53 (!) 54 (!) 49   Resp: 12  12 19   Temp:    97.8 F (36.6 C)  TempSrc:    Oral  SpO2: 98% 97% 96%    Eyes: PERRL, lids and conjunctivae normal ENMT: Mucous membranes are moist. Posterior pharynx clear of any exudate or lesions.Normal dentition.  Neck: normal, supple, no masses, no thyromegaly Respiratory: clear to auscultation bilaterally, no wheezing, no crackles. Normal respiratory effort. No accessory muscle use.  Cardiovascular: Regular rate and rhythm, no murmurs / rubs / gallops. No extremity edema. 2+ pedal pulses. Abdomen: no tenderness, no masses palpated. No hepatosplenomegaly. Bowel sounds positive.  Musculoskeletal: no clubbing / cyanosis. No joint  deformity upper and lower extremities. Good ROM, no contractures. Normal muscle tone.  Skin: no rashes, lesions, ulcers. No induration Neurologic: CN 2-12 grossly intact. Sensation intact,  Strength 5/5 in all 4.  Psychiatric: Normal judgment and insight. Alert and oriented x 3. Normal mood.    Labs on Admission: I have personally reviewed following labs and imaging studies  CBC: Recent Labs  Lab 03/17/22 2354 03/18/22 0011  WBC 10.6*  --   NEUTROABS 6.5  --   HGB 14.5 15.3  HCT 42.9 45.0  MCV 89.6  --   PLT 243  --    Basic Metabolic Panel: Recent Labs  Lab 03/18/22 0011  NA 143  K 3.8  CL 104  GLUCOSE 97  BUN 7  CREATININE 0.80   GFR: CrCl cannot be calculated (Unknown ideal weight.). Liver Function Tests: No results for input(s): "AST", "ALT", "ALKPHOS", "BILITOT", "PROT", "ALBUMIN" in the last 168 hours. No results for input(s): "LIPASE", "AMYLASE" in the last 168 hours. No results for input(s): "AMMONIA" in the last 168 hours. Coagulation Profile: No results for input(s): "INR", "PROTIME" in the last 168 hours. Cardiac Enzymes: No results for input(s): "CKTOTAL", "CKMB", "CKMBINDEX", "TROPONINI" in the last 168 hours. BNP (last 3 results) No results for input(s): "PROBNP" in the last 8760 hours. HbA1C: No results for input(s): "HGBA1C" in the last 72 hours. CBG: No results for input(s): "GLUCAP" in the last 168 hours. Lipid Profile: No results for input(s): "CHOL", "HDL", "LDLCALC", "TRIG", "CHOLHDL", "LDLDIRECT" in the last 72 hours. Thyroid Function Tests: No results for input(s): "TSH", "T4TOTAL", "FREET4", "T3FREE", "THYROIDAB" in the last 72 hours. Anemia Panel: No results for input(s): "VITAMINB12", "FOLATE", "FERRITIN", "TIBC", "IRON", "RETICCTPCT" in the last 72 hours. Urine analysis:    Component Value Date/Time   COLORURINE YELLOW 06/03/2016 1350   APPEARANCEUR CLEAR 06/03/2016 1350   LABSPEC 1.032 (H) 06/03/2016 1350   PHURINE 7.0 06/03/2016  1350   GLUCOSEU NEGATIVE 06/03/2016 1350   HGBUR NEGATIVE 06/03/2016 1350   BILIRUBINUR NEGATIVE 06/03/2016 1350   KETONESUR NEGATIVE 06/03/2016 1350   PROTEINUR NEGATIVE 06/03/2016 1350   NITRITE NEGATIVE 06/03/2016 1350   LEUKOCYTESUR NEGATIVE 06/03/2016 1350    Radiological Exams on Admission: DG Chest Portable 1 View  Result Date: 03/18/2022 CLINICAL DATA:  Pain EXAM: PORTABLE CHEST 1 VIEW COMPARISON:  10/07/2021 FINDINGS: The heart size and mediastinal contours are within normal limits. Both lungs are clear. The visualized skeletal structures are unremarkable. IMPRESSION: Normal study. Electronically Signed   By: Charlett Nose M.D.   On: 03/18/2022 00:27    EKG: Independently reviewed. See above  Assessment/Plan Chest pain r/o ACS -ekg nsr no st -twave chagnes -CE with + delta 14-42  -hx of CAD s/ p NSTEMI -admit to cardiac tele  -place on chest pain protocol  -cycle ce , echo in am  - cardiology consult in  am    HLD -check lipid panel -continue statin    DVT prophylaxis:  Heparin Code Status: full  Family Communication: none at bedside /incarcerated  Plan: patient  expected to be admitted less than 2 midnights  Consults called: consider cardiology please call in am  Admission status: cardiac tele   Clance Boll MD [facility]   If 7PM-7AM, please contact night-coverage [email or web address] Password [facility]  03/18/2022, 4:35 AM

## 2022-03-18 NOTE — Discharge Summary (Signed)
Physician Discharge Summary   Patient: Edward Schroeder MRN: [id number] DOB: 23-May-1980  Admit date:     03/17/2022  Discharge date: 03/18/22  Discharge Physician: Patrecia Pour   PCP: [facility]   Recommendations at discharge:  Follow up with PCP in 1-2 weeks for management of noncardiac chest pain.  Discharge Diagnoses: Principal Problem:   Chest pain  Hospital Course: Edward Schroeder is a 41 y.o. male with a history of HLD, current incarceration, who presented to the ED 11/28 for sharp left chest pain radiating with tingling to left arm occurring while sitting reading a book that evening. This was improved with ASA + NTG that hasn't recurred during the time in the ED. ECG sinus brady, trop 13 > 42 > 39. Had coronary CTA in May 2023 with minimal ostial RCA stenosis, calcium score zero. Cardiology was consulted and he was admitted. Echocardiogram appears normal. ACS is felt to be definitively ruled out and he has been cleared for discharge by the cardiology and medical teams on 03/18/2022.  Assessment and Plan: Chest discomfort: Occurring at rest this episode but alleviated by ASA and NTG. With reassuring coronary evaluation earlier this year, nonischemic appearing ECG and very mild troponin changes, ACS is felt to be unlikely. MSK is certainly possible. - Echo essentially unremarkable. No cardiac etiology of chest pain has been detected during this period of observation. Will follow up with PCP for ongoing care.   - Continue aspirin.    Sinus bradycardia: Worse when sleeping, asymptomatic, no hypotension.  - Not on BB at this time.   Dyslipidemia: LDL 52, HDL 30.  - Continue atorvastatin 80mg   Consultants: Cardiology, Dr. Johnsie Cancel Procedures performed: Echo  Disposition:  Custody of law enforcement Diet recommendation:  Cardiac diet DISCHARGE MEDICATION: Allergies as of 03/18/2022       Reactions   Shellfish Allergy Hives   Pork-derived Products Hives         Medication List     TAKE these medications    Aspirin Low Dose 81 MG tablet Generic drug: aspirin EC Take 81 mg by mouth daily.   atorvastatin 80 MG tablet Commonly known as: LIPITOR Take 80 mg by mouth at bedtime.        [facility] Follow up.   Contact information: [place] [id number] [phone]                Discharge Exam: BP 129/82   Pulse (!) 45   Temp 97.7 F (36.5 C) (Oral)   Resp 17   SpO2 98%   Gen: No distress Pulm: Clear and nonlabored on room air  CV: Regular bradycardia 50's, no murmur, no JVD, no edema. No focal visible or palpable deformity of chest wall.  GI: Soft, NT, ND, +BS  Neuro: Alert and oriented. No focal deficits. Skin: No rashes, lesions or ulcers on visualized skin.   Condition at discharge: stable  The results of significant diagnostics from this hospitalization (including imaging, microbiology, ancillary and laboratory) are listed below for reference.   Imaging Studies: ECHOCARDIOGRAM COMPLETE  Result Date: 03/18/2022    ECHOCARDIOGRAM REPORT   Patient Name:   Edward Schroeder Date of Exam: 03/18/2022 Medical Rec #:  [id number]     Height:       69.0 in Accession #:    [id number]    Weight:       200.0 lb Date of Birth:  1981/03/09    BSA:  2.066 m Patient Age:    7 years      BP:           117/79 mmHg Patient Gender: M             HR:           46 bpm. Exam Location:  Inpatient Procedure: 2D Echo, Cardiac Doppler and Color Doppler Indications:    Chest Pain R07.9  History:        Patient has no prior history of Echocardiogram examinations.                 CAD; Signs/Symptoms:Chest Pain.  Sonographer:    Ronny Flurry Referring Phys: [id number] [place]  1. Left ventricular ejection fraction, by estimation, is 55 to 60%. The left ventricle has normal function. The left ventricle has no regional wall motion abnormalities. Left ventricular  diastolic parameters were normal.  2. Right ventricular systolic function is normal. The right ventricular size is normal. There is normal pulmonary artery systolic pressure.  3. Left atrial size was mildly dilated.  4. The mitral valve is abnormal. Trivial mitral valve regurgitation. No evidence of mitral stenosis.  5. The aortic valve is tricuspid. Aortic valve regurgitation is not visualized. No aortic stenosis is present.  6. The inferior vena cava is normal in size with greater than 50% respiratory variability, suggesting right atrial pressure of 3 mmHg. FINDINGS  Left Ventricle: Left ventricular ejection fraction, by estimation, is 55 to 60%. The left ventricle has normal function. The left ventricle has no regional wall motion abnormalities. The left ventricular internal cavity size was normal in size. There is  no left ventricular hypertrophy. Left ventricular diastolic parameters were normal. Right Ventricle: The right ventricular size is normal. No increase in right ventricular wall thickness. Right ventricular systolic function is normal. There is normal pulmonary artery systolic pressure. The tricuspid regurgitant velocity is 2.20 m/s, and  with an assumed right atrial pressure of 3 mmHg, the estimated right ventricular systolic pressure is 46.9 mmHg. Left Atrium: Left atrial size was mildly dilated. Right Atrium: Right atrial size was normal in size. Pericardium: There is no evidence of pericardial effusion. Mitral Valve: The mitral valve is abnormal. There is mild thickening of the mitral valve leaflet(s). Trivial mitral valve regurgitation. No evidence of mitral valve stenosis. Tricuspid Valve: The tricuspid valve is normal in structure. Tricuspid valve regurgitation is mild . No evidence of tricuspid stenosis. Aortic Valve: The aortic valve is tricuspid. Aortic valve regurgitation is not visualized. No aortic stenosis is present. Aortic valve mean gradient measures 3.0 mmHg. Aortic valve peak  gradient measures 6.1 mmHg. Aortic valve area, by VTI measures 3.34 cm. Pulmonic Valve: The pulmonic valve was normal in structure. Pulmonic valve regurgitation is not visualized. No evidence of pulmonic stenosis. Aorta: The aortic root is normal in size and structure. Venous: The inferior vena cava is normal in size with greater than 50% respiratory variability, suggesting right atrial pressure of 3 mmHg. IAS/Shunts: No atrial level shunt detected by color flow Doppler.  LEFT VENTRICLE PLAX 2D LVIDd:         5.10 cm   Diastology LVIDs:         3.70 cm   LV e' medial:    8.49 cm/s LV PW:         0.90 cm   LV E/e' medial:  11.2 LV IVS:        0.90 cm   LV e' lateral:  16.60 cm/s LVOT diam:     2.20 cm   LV E/e' lateral: 5.7 LV SV:         95 LV SV Index:   46 LVOT Area:     3.80 cm  RIGHT VENTRICLE             IVC RV S prime:     10.60 cm/s  IVC diam: 1.80 cm TAPSE (M-mode): 1.7 cm LEFT ATRIUM             Index        RIGHT ATRIUM           Index LA diam:        3.90 cm 1.89 cm/m   RA Area:     13.60 cm LA Vol (A2C):   27.7 ml 13.41 ml/m  RA Volume:   28.90 ml  13.99 ml/m LA Vol (A4C):   42.1 ml 20.38 ml/m LA Biplane Vol: 36.2 ml 17.52 ml/m  AORTIC VALVE AV Area (Vmax):    3.52 cm AV Area (Vmean):   3.24 cm AV Area (VTI):     3.34 cm AV Vmax:           123.00 cm/s AV Vmean:          80.600 cm/s AV VTI:            0.286 m AV Peak Grad:      6.1 mmHg AV Mean Grad:      3.0 mmHg LVOT Vmax:         114.00 cm/s LVOT Vmean:        68.700 cm/s LVOT VTI:          0.251 m LVOT/AV VTI ratio: 0.88  AORTA Ao Root diam: 3.40 cm Ao Asc diam:  3.00 cm MITRAL VALVE               TRICUSPID VALVE MV Area (PHT): 3.77 cm    TR Peak grad:   19.4 mmHg MV Decel Time: 201 msec    TR Vmax:        220.00 cm/s MV E velocity: 94.90 cm/s MV A velocity: 46.60 cm/s  SHUNTS MV E/A ratio:  2.04        Systemic VTI:  0.25 m                            Systemic Diam: 2.20 cm Jenkins Rouge MD Electronically signed by Jenkins Rouge MD  Signature Date/Time: 03/18/2022/12:27:09 PM    Final    DG Chest Portable 1 View  Result Date: 03/18/2022 CLINICAL DATA:  Pain EXAM: PORTABLE CHEST 1 VIEW COMPARISON:  10/07/2021 FINDINGS: The heart size and mediastinal contours are within normal limits. Both lungs are clear. The visualized skeletal structures are unremarkable. IMPRESSION: Normal study. Electronically Signed   By: Rolm Baptise M.D.   On: 03/18/2022 00:27    Microbiology: Results for orders placed or performed during the hospital encounter of 03/13/19  Novel Coronavirus, NAA (Hosp order, Send-out to Ref Lab; TAT 18-24 hrs     Status: None   Collection Time: 03/13/19  7:25 PM   Specimen: Nasopharyngeal Swab; Respiratory  Result Value Ref Range Status   SARS-CoV-2, NAA NOT DETECTED NOT DETECTED Final   Coronavirus Source NASOPHARYNGEAL  Final    Comment: Performed at [facility], [address] [place], [place] [address]    Labs: CBC: Recent Labs  Lab 03/17/22  2354 03/18/22 0011  WBC 10.6*  --   NEUTROABS 6.5  --   HGB 14.5 15.3  HCT 42.9 45.0  MCV 89.6  --   PLT 243  --    Basic Metabolic Panel: Recent Labs  Lab 03/18/22 0011  NA 143  K 3.8  CL 104  GLUCOSE 97  BUN 7  CREATININE 0.80   Discharge time spent: greater than 30 minutes.  Signed: Patrecia Pour, MD [facility] 03/18/2022

## 2022-05-18 ENCOUNTER — Ambulatory Visit (HOSPITAL_COMMUNITY)
Admission: EM | Admit: 2022-05-18 | Discharge: 2022-05-18 | Disposition: A | Discharge status: Home / Self Care | Payer: No Payment, Other | Attending: Behavioral Health | Admitting: Behavioral Health

## 2022-05-18 DIAGNOSIS — Z1152 Encounter for screening for COVID-19: Secondary | ICD-10-CM | POA: Insufficient documentation

## 2022-05-18 DIAGNOSIS — R45851 Suicidal ideations: Secondary | ICD-10-CM

## 2022-05-18 DIAGNOSIS — Z046 Encounter for general psychiatric examination, requested by authority: Secondary | ICD-10-CM

## 2022-05-18 LAB — COMPREHENSIVE METABOLIC PANEL
ALT: 30 U/L (ref 0–44)
AST: 32 U/L (ref 15–41)
Albumin: 4.6 g/dL (ref 3.5–5.0)
Alkaline Phosphatase: 67 U/L (ref 38–126)
Anion gap: 10 (ref 5–15)
BUN: 11 mg/dL (ref 6–20)
CO2: 22 mmol/L (ref 22–32)
Calcium: 9.7 mg/dL (ref 8.9–10.3)
Chloride: 103 mmol/L (ref 98–111)
Creatinine, Ser: 0.79 mg/dL (ref 0.61–1.24)
GFR, Estimated: >60 mL/min (ref >60)
Glucose, Bld: 106 mg/dL — ABNORMAL HIGH (ref 70–99)
Potassium: 3.9 mmol/L (ref 3.5–5.1)
Sodium: 135 mmol/L (ref 135–145)
Total Bilirubin: 0.3 mg/dL (ref 0.3–1.2)
Total Protein: 7.5 g/dL (ref 6.5–8.1)

## 2022-05-18 LAB — POCT URINE DRUG SCREEN - MANUAL ENTRY (I-SCREEN)
POC Amphetamine UR: NOT DETECTED
POC Buprenorphine (BUP): NOT DETECTED
POC Cocaine UR: NOT DETECTED
POC Marijuana UR: POSITIVE — AB
POC Methadone UR: NOT DETECTED
POC Methamphetamine UR: NOT DETECTED
POC Morphine: NOT DETECTED
POC Oxazepam (BZO): NOT DETECTED
POC Oxycodone UR: NOT DETECTED
POC Secobarbital (BAR): NOT DETECTED

## 2022-05-18 LAB — CBC WITH DIFFERENTIAL/PLATELET
Abs Immature Granulocytes: 0.04 10*3/uL (ref 0.00–0.07)
Basophils Absolute: 0.1 10*3/uL (ref 0.0–0.1)
Basophils Relative: 1 %
Eosinophils Absolute: 0.2 10*3/uL (ref 0.0–0.5)
Eosinophils Relative: 2 %
HCT: 47.9 % (ref 39.0–52.0)
Hemoglobin: 17 g/dL (ref 13.0–17.0)
Immature Granulocytes: 0 %
Lymphocytes Relative: 28 %
Lymphs Abs: 2.6 10*3/uL (ref 0.7–4.0)
MCH: 31.6 pg (ref 26.0–34.0)
MCHC: 35.5 g/dL (ref 30.0–36.0)
MCV: 89 fL (ref 80.0–100.0)
Monocytes Absolute: 0.8 10*3/uL (ref 0.1–1.0)
Monocytes Relative: 9 %
Neutro Abs: 5.6 10*3/uL (ref 1.7–7.7)
Neutrophils Relative %: 60 %
Platelets: 243 10*3/uL (ref 150–400)
RBC: 5.38 MIL/uL (ref 4.22–5.81)
RDW: 15.2 % (ref 11.5–15.5)
WBC: 9.3 10*3/uL (ref 4.0–10.5)
nRBC: 0 % (ref 0.0–0.2)

## 2022-05-18 LAB — LIPID PANEL
Cholesterol: 263 mg/dL — ABNORMAL HIGH (ref 0–200)
HDL: 48 mg/dL (ref >40)
LDL Cholesterol: 158 mg/dL — ABNORMAL HIGH (ref 0–99)
Total CHOL/HDL Ratio: 5.5 RATIO
Triglycerides: 286 mg/dL — ABNORMAL HIGH (ref <150)
VLDL: 57 mg/dL — ABNORMAL HIGH (ref 0–40)

## 2022-05-18 LAB — POC SARS CORONAVIRUS 2 AG: SARSCOV2ONAVIRUS 2 AG: NEGATIVE

## 2022-05-18 LAB — RESP PANEL BY RT-PCR (RSV, FLU A&B, COVID)  RVPGX2
Influenza A by PCR: NEGATIVE
Influenza B by PCR: NEGATIVE
Resp Syncytial Virus by PCR: NEGATIVE
SARS Coronavirus 2 by RT PCR: NEGATIVE

## 2022-05-18 LAB — ETHANOL: Alcohol, Ethyl (B): 60 mg/dL — ABNORMAL HIGH (ref <10)

## 2022-05-18 LAB — TSH: TSH: 0.682 u[IU]/mL (ref 0.350–4.500)

## 2022-05-18 MED ORDER — HYDROXYZINE HCL 25 MG PO TABS: 25.0000 mg | ORAL_TABLET | Freq: Three times a day (TID) | ORAL | Status: DC | PRN

## 2022-05-18 MED ORDER — ACETAMINOPHEN 325 MG PO TABS: 650.0000 mg | ORAL_TABLET | Freq: Four times a day (QID) | ORAL | Status: DC | PRN

## 2022-05-18 MED ORDER — ALUM & MAG HYDROXIDE-SIMETH 200-200-20 MG/5ML PO SUSP: 30.0000 mL | ORAL | Status: DC | PRN

## 2022-05-18 MED ORDER — MAGNESIUM HYDROXIDE 400 MG/5ML PO SUSP: 30.0000 mL | Freq: Every day | ORAL | Status: DC | PRN

## 2022-05-18 MED ORDER — TRAZODONE HCL 50 MG PO TABS: 50.0000 mg | ORAL_TABLET | Freq: Every evening | ORAL | Status: DC | PRN

## 2022-05-18 NOTE — ED Notes (Signed)
Belongings are in locker 27 shoes, jacket,cell phone,money,keys

## 2022-05-18 NOTE — Discharge Instructions (Addendum)
0Discharge recommendations:   Outpatient Follow up: Please review list of outpatient resources for psychiatry and counseling. Please follow up with your primary care provider for all medical related needs.   You are encouraged to follow up with [facility] for outpatient treatment.  Walk in/ Open Access Hours: Monday - Friday 8AM - 11AM (To see provider and therapist) Friday - 1PM - 4PM (To see therapist only)  [facility] [address] [place], [place] [phone]  Therapy: We recommend that patient participate in individual therapy to address mental health concerns.  Safety:   The following safety precautions should be taken:   No sharp objects. This includes scissors, razors, scrapers, and putty knives.   Chemicals should be removed and locked up.   Medications should be removed and locked up.   Weapons should be removed and locked up. This includes firearms, knives and instruments that can be used to cause injury.   The patient should abstain from use of illicit substances/drugs and abuse of any medications.  If symptoms worsen or do not continue to improve or if the patient becomes actively suicidal or homicidal then it is recommended that the patient return to the closest hospital emergency department, the [facility], or call 911 for further evaluation and treatment. National Suicide Prevention Lifeline 1-800-SUICIDE or 337-063-0343.  About 988 988 offers 24/7 access to trained crisis counselors who can help people experiencing mental health-related distress. People can call or text 988 or chat 988lifeline.org for themselves or if they are worried about a loved one who may need crisis support.

## 2022-05-18 NOTE — BH Assessment (Addendum)
Comprehensive Clinical Assessment (CCA) Note  05/18/2022 Edward Schroeder [id number]  Disposition: Per Darrol Angel, NP, patient is recommended for discharge.  The patient demonstrates the following risk factors for suicide: Chronic risk factors for suicide include: N/A. Acute risk factors for suicide include: family or marital conflict. Protective factors for this patient include: positive social support, responsibility to others (children, family), and hope for the future. Considering these factors, the overall suicide risk at this point appears to be high. Patient is not appropriate for outpatient follow up.  Edward Schroeder is a 41 year old male presenting to [facility] with [employer] under IVC. Per IVC "RESPONDENT WAS STANDING ON THE BRIDGE ON [address]. RESPONDENT TOLD A CALLER THAT HE WAS GOING TO JUMP OFF THE BRIDGE AND KILL HIMSELF. RESPONDENT STATED THAT HE HAD JUST LOST HIS GIRLFRIEND AND DIDN'T WANT TO LIVE ANY LONGER. RESPONDENT IS A DANGER TO HIMSELF AND NEEDS TO BE EVALUATED FOR POSSIBLE MENTAL ILLNESS".   Patient reports he was sitting on the side of the bridge "stressed" and reports having "crazy thoughts inside my head". Patient reports he was sitting on the street side of the bridge smoking a black and mild and he had thoughts about jumping off the bridge. Patient reports his girlfriend left him this morning however reports she moved out the house a few days ago. Patient has been with his girlfriend for two years and engaged for some months. Patient is guarded about issues he was having in his relationship. Patient also reports stress from being unemployed since August. Patient  reports he had a job interview today at [employer]. 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 Patient lives at home with his 78 year old daughter and grandchild. Patient reports he takes care of his daughter and his son birthday party is tomorrow. Patient has 5 kids and 2 grandchildren.   Patient  denies prior mental health diagnosis and treatment. Patient does not have outpatient services nor does he have a history of inpatient treatment. Patient denies legal issues and he denies having access to a firearm. Patient reports drinking alcohol occasionally and his last alcoholic drink was two days ago. Patient also reports smoking marijuana.   Patient is oriented x4, engaged, alert and cooperative during assessment. Patient eye contact and speech is normal. Patient denies current SI, HI, AVH.   Patient consents TTS to obtain collateral from his mother Edward Schroeder [phone] who reports that she is aware that patient is here today due to the police calling her. Mom reports from her understanding patient and his girlfriend been having some issues and last night the girlfriend went over patient house and they were arguing again. Mom reports it was concerning to hear about patein. Mom reports that patient has a strong support system but he did not reach out to anyone. Mom reports that patient has a job interview this morning and his son birthday party is tomorrow.     TTS spoke to patient mother and discussed methods to reduce the risk of self-injury or suicide attempts: Frequent conversations regarding unsafe thoughts. Remove all significant sharps. Remove all firearms. Remove all medications, including over-the-counter medications. Calling [phone] if patient needs additional support and to follow up with outpatient treatment.    Chief Complaint:  Chief Complaint  Patient presents with   Suicidal   Visit Diagnosis:  Suicidal ideation  Involuntary commitment        CCA Screening, Triage and Referral (STR)  Patient  Reported Information How did you hear about [place]? Other (Comment)  What Is the Reason for Your Visit/Call Today? Pt was sitting on the bridge contemplating suicide.  How Long Has This Been Causing You Problems? 1 wk - 1 month  What Do You Feel Would Help You the Most Today?  Treatment for Depression or other mood problem   Have You Recently Had Any Thoughts About Hurting Yourself? Yes  Are You Planning to Commit Suicide/Harm Yourself At This time? No   Flowsheet Row ED from 05/18/2022 in [facility] ED from 10/07/2021 in [place] ED from 09/19/2021 in [place] High Risk No Risk No Risk       Have you Recently Had Thoughts About [place]? No  Are You Planning to Harm Someone at This Time? No  Explanation: No data recorded  Have You Used Any Alcohol or Drugs in the Past 24 Hours? No  What Did You Use and How Much? NA   Do You Currently Have a Therapist/Psychiatrist? No  Name of Therapist/Psychiatrist: Name of Therapist/Psychiatrist: NA   Have You Been Recently Discharged From Any Office Practice or Programs? No  Explanation of Discharge From Practice/Program: NA     CCA Screening Triage Referral Assessment Type of Contact: Face-to-Face  Telemedicine Service Delivery:   Is this Initial or Reassessment?   Date Telepsych consult ordered in [facility]:    Time Telepsych consult ordered in [facility]:    Location of Assessment: [facility]  Provider Location: [facility] Assessment Services   Collateral Involvement: GPD   Does Patient Have a Stage manager Guardian? No  Legal Guardian Contact Information: NA  Copy of Legal Guardianship Form: -- (NA)  Legal Guardian Notified of Arrival: -- (NA)  Legal Guardian Notified of Pending Discharge: -- (NA)  If Minor and Not Living with Parent(s), Who has Custody? NA  Is CPS involved or ever been involved? Never  Is APS involved or ever been involved? Never   Patient Determined To Be At Risk for Harm To Self or Others Based on Review of Patient Reported Information or Presenting Complaint? Yes, for Self-Harm  Method: No Plan  Availability  of Means: No access or NA  Intent: Vague intent or NA  Notification Required: No need or identified person  Additional Information for Danger to Others Potential: -- (NA)  Additional Comments for Danger to Others Potential: NA  Are There Guns or Other Weapons in Your Home? No  Types of Guns/Weapons: NA  Are These Weapons Safely Secured?                            -- (NA)  Who Could Verify You Are Able To Have These Secured: NA  Do You Have any Outstanding Charges, Pending Court Dates, Parole/Probation? NO  Contacted To Inform of Risk of Harm To Self or Others: Family/Significant Other:    Does Patient Present under Involuntary Commitment? Yes    [place] of Residence: [place]   Patient Currently Receiving the Following Services: Not Receiving Services   Determination of Need: Emergent (2 hours)   Options For Referral: Medication Management; Outpatient Therapy; Inpatient Hospitalization     CCA Biopsychosocial Patient Reported Schizophrenia/Schizoaffective Diagnosis in Past: No   Strengths: supportive to his family   Mental Health Symptoms Depression:   None   Duration of Depressive symptoms:  Mania:   None   Anxiety:    Worrying   Psychosis:   None   Duration of Psychotic symptoms:    Trauma:   None   Obsessions:   None   Compulsions:   None   Inattention:   None   Hyperactivity/Impulsivity:   None   Oppositional/Defiant Behaviors:   None   Emotional Irregularity:   None   Other Mood/Personality Symptoms:   none    Mental Status Exam Appearance and self-care  Stature:   Average   Weight:   Average weight   Clothing:   Neat/clean; Age-appropriate   Grooming:   Normal   Cosmetic use:   None   Posture/gait:   Normal   Motor activity:   Not Remarkable   Sensorium  Attention:   Normal   Concentration:   Normal   Orientation:   Person; Place; Situation   Recall/memory:   Normal   Affect and Mood   Affect:   Depressed   Mood:   Depressed   Relating  Eye contact:   Normal   Facial expression:   Responsive   Attitude toward examiner:   Guarded   Thought and Language  Speech flow:  Clear and Coherent   Thought content:   Appropriate to Mood and Circumstances   Preoccupation:   None   Hallucinations:   None   Organization:   Coherent   [employer] of Knowledge:   Fair   Intelligence:   Average   Abstraction:   Normal   Judgement:   Poor   Reality Testing:   Adequate   Insight:   Fair   Decision Making:   Normal   Social Functioning  Social Maturity:   Responsible   Social Judgement:   Normal   Stress  Stressors:   Surveyor, quantity; Relationship   Coping Ability:   Human resources officer Deficits:   None   Supports:   Family     Religion: Religion/Spirituality Are You A Religious Person?:  (UTA) How Might This Affect Treatment?: UTA  Leisure/Recreation: Leisure / Recreation Do You Have Hobbies?:  (UTA)  Exercise/Diet: Exercise/Diet Do You Exercise?:  (UTA) Have You Gained or Lost A Significant Amount of Weight in the Past Six Months?:  (UTA) Do You Follow a Special Diet?: No Do You Have Any Trouble Sleeping?: No   CCA Employment/Education Employment/Work Situation: Employment / Work Situation Employment Situation: Employed (self-employed) Work Stressors: pt reports he has been out of work since august and 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 Patient's Job has Been Impacted by Current Illness: No Has Patient ever Been in the [employer]?: No  Education: Education Is Patient Currently Attending School?: No Did You Attend College?:  [employer]) Did You Have An Individualized Education Program (IIEP):  (UTA) Did You Have Any Difficulty At School?:  (UTA)   CCA Family/Childhood History Family and Relationship History: Family history Does patient have children?: Yes How many children?: 5 How is  patient's relationship with their children?: Pt lives with his 39 year old daughter  Childhood History:  Childhood History By whom was/is the patient raised?:  (UTA) Did patient suffer any verbal/emotional/physical/sexual abuse as a child?:  (UTA) Did patient suffer from severe childhood neglect?:  (UTA) Has patient ever been sexually abused/assaulted/raped as an adolescent or adult?:  (UTA) Was the patient ever a victim of a crime or a disaster?:  (UTA) Witnessed domestic violence?:  (UTA) Has patient been affected by domestic  violence as an adult?:  (UTA)       CCA Substance Use Alcohol/Drug Use: Alcohol / Drug Use Pain Medications: SEE MAR Prescriptions: SEE MAR Over the Counter: SEE MAR History of alcohol / drug use?: Yes Substance #1 Name of Substance 1: ALCOHOL 1 - Frequency: "Occasional" 1 - Last Use / Amount: two days ago Substance #2 Name of Substance 2: THC                     ASAM's:  Six Dimensions of Multidimensional Assessment  Dimension 1:  Acute Intoxication and/or Withdrawal Potential:      Dimension 2:  Biomedical Conditions and Complications:      Dimension 3:  Emotional, Behavioral, or Cognitive Conditions and Complications:     Dimension 4:  Readiness to Change:     Dimension 5:  Relapse, Continued use, or Continued Problem Potential:     Dimension 6:  Recovery/Living Environment:     ASAM Severity Score:    ASAM Recommended Level of Treatment:     Substance use Disorder (SUD)    Recommendations for Services/Supports/Treatments: Recommendations for Services/Supports/Treatments Recommendations For Services/Supports/Treatments: Individual Therapy  Discharge Disposition: Discharge Disposition Disposition of Patient: Admit Mode of transportation if patient is discharged/movement?: Other (comment)  DSM5 Diagnoses: Patient Active Problem List   Diagnosis Date Noted   Chest pain 03/18/2022   Elevated troponin    Precordial pain     Coronary artery disease involving native coronary artery of native heart    Influenza    Fever in adult 06/03/2016   Headache 06/03/2016   Acute back pain      Referrals to Alternative Service(s): Referred to Alternative Service(s):   Place:   Date:   Time:    Referred to Alternative Service(s):   Place:   Date:   Time:    Referred to Alternative Service(s):   Place:   Date:   Time:    Referred to Alternative Service(s):   Place:   Date:   Time:     Luther Redo, [facility]

## 2022-05-18 NOTE — ED Notes (Signed)
Patient A&O x 4, ambulatory. Patient discharged in no acute distress. Patient denied SI/HI, A/VH upon discharge. Patient verbalized understanding of all discharge instructions explained by staff, to include follow up appointments and safety plan.  Pt belongings returned to patient from locker #17 intact. Patient escorted to lobby via staff for transport to home via his mother. Safety maintained.

## 2022-05-18 NOTE — ED Notes (Signed)
Pt  brought in by [employer] . Patient denies SI/HI/AVH. Calm, cooperative throughout interview process. Skin assessment completed. Oriented to unit. Meal and drink offered. At currrent, pt continue to deny SI/HI/AVH. Pt verbally contract for safety. Patient states that he was on the bridge trying to get attention from his girl friend.States that he was not going to jump. Will monitor for safety.

## 2022-05-18 NOTE — ED Provider Notes (Signed)
[facility] Medical Screening Exam  Patient Name: Edward Schroeder MRN: [id number] Date of Evaluation: 05/18/22 Chief Complaint:   Diagnosis:  Final diagnoses:  Suicidal ideation  Involuntary commitment    History of Present illness: Edward FannyCalvin Tawil is a 41 year old male patient with no past psychiatric history who [facility] accompanied by law enforcement with complaints of suicidal ideations.    Per IVC, petitioned by officer Hilton: Respondent was standing on the bridge on [address]. Respondent told a caller that he was going to jump off the bride and kill himself. Respondent stated that he had just lost his girlfriend and didn't want to live any longer. Respondent is a danger to himself and needs to be evaluated for possible mental illness.    Patient seen and evaluated face to face by this provider, chart reviewed, and case discussed with Dr. Lucianne MussKumar. Per chart review, no psychiatric history of file. On evaluation, patient is alert and oriented x 4. His thought process is linear, and speech is clear and coherent at a moderate tone. He appears guarded and forwards little information. His mood is dysphoric, and affect is congruent.    Patient states that he was sitting on the side of the bridge this morning. When asked why he was sitting on the side of the bridge, he states, "crazy thoughts in my head." When asked to describe the "crazy thoughts," he states, thoughts of jumping because his fianc left him this morning. When asked what stopped him from committing suicide, he states, "I have no idea." He states that he's been engaged to his fianc for months and together for two years. He does not further elaborate on why his fianc left him this morning but states that they have been having some "slight issues."    He denies SI at this time. He denies past suicide attempts. He denies HI. He denies AVH. There is no objective evidence that the patient is  responding to internal or external stimuli. He denies depressive symptoms. He reports fair sleep. He reports a fair appetite.     He resides with his 39 year old daughter and grandchild. He denies access to guns in the home. He is currently unemployed but works as needed for [employer] and as a Brewing technologistmobile mechanic. He reports drinking alcohol socially and smoking marijuana "sometimes." He reports last consuming alcohol "couple days ago." He denies legal issues. He denies past inpatient psychiatric hospitalizations. He denies a past or present psychiatric or medical history. He is not taking prescribed medications.   Update: Patient requested to speak with this provider for a second time this morning. Patient states that it was all attention seeking. He states that he was the one who called the police. He states that he thought the cop would call his fianc and she would come get him. He states that he would never attempt suicide. He states that he really needs to leave because he has an interview today and his son turns 3 and is having a birthday party tomorrow. I discussed with the patient the severity of his actions and poor judgement today and how we as mental health professions take suicide seriously as we cannot predict suicide events. Patient verbalizes understanding. He states that his mother is supportive and gives verbal consent for this provider and the TTS counselor to obtain collateral information for his mother Ubaldo Glassingonya Bullard [phone].   I spoke to the patient's mother Mrs. Tonya Bullard via telephone. Mrs. Larence PenningBullard states that  she does not believe that her son had intentions on jumping off a bridge today. She states that she sees it as a "cry for attention." She states that the patient has been having ongoing relationship issues. She states that her son let his emotions get the best of him today. She states that she has been talking with the patient all week and that today used poor judgment. She  states that she does not think he is suicidal or danger to himself. She states, "he is just emotional." She states that the patient's girlfriend broke up with him on Christmas Eve and then she showed up last night and the two had an argument which she believes triggered today's events. She states that the patient has never attempted suicide in the past. She denies any safety concerns with the patient returning home today.  She confirms that the patient does not have access to weapons or guns in the home. She confirms that the patient had an interview scheduled for today with a [employer] and that his child's birthday party is tomorrow. She states that if the patient could be released that she will pick the patient up from the facility today.  Patient was admitted to the unit from 7:10 am to 1448 for observation. He was observed with out any disruptive, agressive, psychotic or self harm behaviors. Patient was adamant that the situation was all "attention seeking" and requested to discharge so that he could make it to his son's birthday party tomorrow and follow-up on his interview that was scheduled for today. Patient protective factors includes, sense of responsibility to family and children, he is future oriented, he identifies a healthy support system which includes his mother, no past suicide attempts or past psychiatric history.  IVC to be rescinded. Patient to be discharged today with recommendations to follow up with outpatient psychiatry and counseling.  Flowsheet Row ED from 05/18/2022 in [facility] ED from 10/07/2021 in [facility]GENCY DEPARTMENT ED from 09/19/2021 in [facility] EMERGENCY DEPARTMENT  C-SSRS RISK CATEGORY High Risk No Risk No Risk       Psychiatric Specialty Exam  Presentation  General Appearance:Appropriate for Environment  Eye Contact:Fair  Speech:Clear and Coherent  Speech Volume:Normal  Mood and  Affect  Mood: Dysphoric  Affect: Congruent   Thought Process  Thought Processes: Coherent  Descriptions of Associations:Intact  Orientation:Full (Time, Place and Person)  Thought Content:Logical  Diagnosis of Schizophrenia or Schizoaffective disorder in past: No   Hallucinations:None  Ideas of Reference:None  Suicidal Thoughts:No (IVC for suicidal ideations earlier today)  Homicidal Thoughts:No   Sensorium  Memory: Immediate Fair; Recent Fair; Remote Fair  Judgment: Poor  Insight: Lacking   Executive Functions  Concentration: Fair  Attention Span: Fair  Recall: [employer] of Knowledge: Fair  Language: Fair   Psychomotor Activity  Psychomotor Activity: Normal   Assets  Assets: Manufacturing systems engineer; Housing; Leisure Time; Physical Health; Social Support   Sleep  Sleep: Fair  Number of hours:  7   Nutritional Assessment (For OBS and FBC admissions only) Has the patient had a weight loss or gain of 10 pounds or more in the last 3 months?: No Has the patient had a decrease in food intake/or appetite?: No Does the patient have dental problems?: No Does the patient have eating habits or behaviors that may be indicators of an eating disorder including binging or inducing vomiting?: No Has the patient recently lost weight without  trying?: 0 Has the patient been eating poorly because of a decreased appetite?: 0 Malnutrition Screening Tool Score: 0    Physical Exam: Physical Exam HENT:     Head: Normocephalic.     Nose: Nose normal.  Eyes:     Conjunctiva/sclera: Conjunctivae normal.  Cardiovascular:     Rate and Rhythm: Normal rate.  Pulmonary:     Effort: Pulmonary effort is normal.  Musculoskeletal:        General: Normal range of motion.     Cervical back: Normal range of motion.  Neurological:     Mental Status: He is alert and oriented to person, place, and time.    Review of Systems  Constitutional: Negative.   HENT:  Negative.    Eyes: Negative.   Respiratory: Negative.    Cardiovascular: Negative.   Gastrointestinal: Negative.   Genitourinary: Negative.   Musculoskeletal: Negative.   Endo/Heme/Allergies: Negative.   Psychiatric/Behavioral: Negative.     Blood pressure 131/83, pulse 80, temperature 98.6 F (37 C), resp. rate 18, SpO2 96 %. There is no height or weight on file to calculate BMI.  Musculoskeletal: Strength & Muscle Tone: within normal limits Gait & Station: normal Patient leans: N/A   [facility] MSE Discharge Disposition for Follow up and Recommendations: Based on my evaluation the patient does not appear to have an emergency medical condition and can be discharged with resources and follow up care in outpatient services for Individual Therapy and Group Therapy  Rescind IVC After thorough evaluation and review of information currently presented on assessment of Tyberius Ryner (respondent), there is insufficient findings to indicate respondent meets criteria for involuntary commitment or require an inpatient level of care.  Respondent is alert/oriented x 4; calm/cooperative; and mood congruent with affect. Respondent is speaking in a clear tone at moderate volume, and normal pace, with good eye contact. Respondents' thought process is coherent and relevant; There is no indication that the respondent is currently responding to internal/external stimuli or experiencing delusional thought content. Respondent has denied suicidal/self-harm/homicidal ideation, psychosis, and paranoia. Respondent has remained calm throughout assessment and has answered questions appropriately. Currently respondent is not significantly psychotic, or manic on exam. Collateral information obtained from the respondent's mother who voiced no safety concerns or believes that the respondent would attempt suicide. There is no evidence of imminent risk to self or others at present and respondent does not meet criteria for  psychiatric inpatient admission.  Discharge recommendations:   Outpatient Follow up: Please review list of outpatient resources for psychiatry and counseling. Please follow up with your primary care provider for all medical related needs.   You are encouraged to follow up with [facility] for outpatient treatment.  Walk in/ Open Access Hours: Monday - Friday 8AM - 11AM (To see provider and therapist) Friday - 1PM - 4PM (To see therapist only)  [facility] [address] [place], [place] [phone]  Therapy: We recommend that patient participate in individual therapy to address mental health concerns.  Safety:   The following safety precautions should be taken:   No sharp objects. This includes scissors, razors, scrapers, and putty knives.   Chemicals should be removed and locked up.   Medications should be removed and locked up.   Weapons should be removed and locked up. This includes firearms, knives and instruments that can be used to cause injury.   The patient should abstain from use of illicit substances/drugs and abuse of any medications.  If symptoms worsen  or do not continue to improve or if the patient becomes actively suicidal or homicidal then it is recommended that the patient return to the closest hospital emergency department, the [facility], or call 911 for further evaluation and treatment. National Suicide Prevention Lifeline 1-800-SUICIDE or (208)421-8050.  About 988 988 offers 24/7 access to trained crisis counselors who can help people experiencing mental health-related distress. People can call or text 988 or chat 988lifeline.org for themselves or if they are worried about a loved one who may need crisis support.    Chantrell Apsey L, NP 05/18/2022, 11:02 AM

## 2022-05-19 LAB — HEMOGLOBIN A1C
Hgb A1c MFr Bld: 5.7 % — ABNORMAL HIGH (ref 4.8–5.6)
Mean Plasma Glucose: 117 mg/dL

## 2022-08-23 DIAGNOSIS — R0789 Other chest pain: Secondary | ICD-10-CM | POA: Diagnosis not present

## 2022-08-23 DIAGNOSIS — R079 Chest pain, unspecified: Secondary | ICD-10-CM | POA: Diagnosis not present

## 2022-09-15 ENCOUNTER — Emergency Department (HOSPITAL_BASED_OUTPATIENT_CLINIC_OR_DEPARTMENT_OTHER)
Admission: EM | Admit: 2022-09-15 | Discharge: 2022-09-15 | Disposition: A | Discharge status: Home / Self Care | Payer: Medicaid Other | Attending: Emergency Medicine | Admitting: Emergency Medicine

## 2022-09-15 ENCOUNTER — Other Ambulatory Visit: Payer: Self-pay

## 2022-09-15 ENCOUNTER — Encounter (HOSPITAL_BASED_OUTPATIENT_CLINIC_OR_DEPARTMENT_OTHER): Payer: Self-pay

## 2022-09-15 DIAGNOSIS — M545 Low back pain, unspecified: Secondary | ICD-10-CM

## 2022-09-15 DIAGNOSIS — M5459 Other low back pain: Secondary | ICD-10-CM | POA: Diagnosis not present

## 2022-09-15 HISTORY — DX: Atherosclerotic heart disease of native coronary artery without angina pectoris: I25.10

## 2022-09-15 MED ORDER — KETOROLAC TROMETHAMINE 60 MG/2ML IM SOLN
60.0000 mg | Freq: Once | INTRAMUSCULAR | Status: AC
  Administered 2022-09-15: 60 mg via INTRAMUSCULAR
  Filled 2022-09-15: qty 2

## 2022-09-15 MED ORDER — NAPROXEN 500 MG PO TABS: 500.0000 mg | ORAL_TABLET | Freq: Two times a day (BID) | ORAL | 0 refills | Status: DC

## 2022-09-15 MED ORDER — TRAMADOL HCL 50 MG PO TABS: 50.0000 mg | ORAL_TABLET | Freq: Four times a day (QID) | ORAL | 0 refills | Status: DC | PRN

## 2022-09-15 NOTE — ED Provider Notes (Signed)
  [place] EMERGENCY DEPARTMENT AT [facility] Provider Note   CSN: [id number] Arrival date & time: 09/15/22  8657     History  Chief Complaint  Patient presents with   Back Pain    Woke up with back pain yesterday, no known injury points to lumbar area     Edward Schroeder is a 41 y.o. male.  Patient is a 42 year old male presenting with complaints of low back pain.  This started yesterday morning and was present upon waking up.  This began in the absence of any injury or trauma.  He denies any radiation of the pain into the legs.  He denies any bowel or bladder complaints.  Pain is worse with movement and changing position.  It is relieved with rest.  The history is provided by the patient.       Home Medications Prior to Admission medications   Medication Sig Start Date End Date Taking? Authorizing Provider  aspirin 81 MG chewable tablet Chew 81 mg by mouth once. 02/03/22  Yes [provider]      Allergies    Shellfish allergy and Pork-derived products    Review of Systems   Review of Systems  All other systems reviewed and are negative.   Physical Exam Updated Vital Signs BP 120/74 (BP Location: Left Arm)   Pulse 71   Temp 98.2 F (36.8 C) (Oral)   Resp 20   Ht 5\' 10"  (1.778 m)   Wt 89.8 kg   SpO2 100%   BMI 28.41 kg/m  Physical Exam Vitals and nursing note reviewed.  Constitutional:      Appearance: Normal appearance.  HENT:     Head: Normocephalic and atraumatic.  Pulmonary:     Effort: Pulmonary effort is normal.  Musculoskeletal:     Comments: There is tenderness to palpation in the soft tissues of the right lower lumbar region.  There is no bony tenderness.  Skin:    General: Skin is warm and dry.  Neurological:     Mental Status: He is alert and oriented to person, place, and time.     Comments: DTRs are trace and symmetrical in the patellar and Achilles tendons bilaterally.  Strength is 5 out of 5 in both lower extremities.   Patient is able to ambulate on heels and toes.     ED Results / Procedures / Treatments   Labs (all labs ordered are listed, but only abnormal results are displayed) Labs Reviewed - No data to display  EKG None  Radiology No results found.  Procedures Procedures    Medications Ordered in ED Medications  ketorolac (TORADOL) injection 60 mg (has no administration in time range)    ED Course/ Medical Decision Making/ A&P  Patient presenting with complaints of musculoskeletal low back pain as described in the HPI.  He arrives here with stable vital signs and physical examination which is reassuring.    IM Toradol administered.  Patient to be discharged with naproxen and tramadol.  There are no red flags that would suggest an emergent situation.  I feel patient can safely be discharged.  Final Clinical Impression(s) / ED Diagnoses Final diagnoses:  None    Rx / DC Orders ED Discharge Orders     None         Geoffery Lyons, MD 09/15/22 [phone]

## 2022-09-15 NOTE — Discharge Instructions (Signed)
Begin taking naproxen as prescribed.  Begin taking hydrocodone as prescribed as needed for pain not relieved with naproxen.  Rest.  Follow-up with primary doctor if not improving in the next 7 to 10 days.

## 2022-09-15 NOTE — ED Triage Notes (Signed)
Woke up with back pain yesterday, no known injury points to lumbar area  5/10

## 2022-09-16 ENCOUNTER — Observation Stay (HOSPITAL_COMMUNITY)
Admission: EM | Admit: 2022-09-16 | Discharge: 2022-09-17 | Disposition: A | Discharge status: Home / Self Care | Payer: Medicaid Other | Attending: Emergency Medicine | Admitting: Emergency Medicine

## 2022-09-16 ENCOUNTER — Encounter (HOSPITAL_COMMUNITY): Payer: Self-pay

## 2022-09-16 ENCOUNTER — Other Ambulatory Visit: Payer: Self-pay

## 2022-09-16 ENCOUNTER — Emergency Department (HOSPITAL_COMMUNITY)
Admission: EM | Admit: 2022-09-16 | Discharge: 2022-09-16 | Disposition: A | Discharge status: Home / Self Care | Payer: Medicaid Other | Source: Home / Self Care | Attending: Emergency Medicine | Admitting: Emergency Medicine

## 2022-09-16 ENCOUNTER — Telehealth: Payer: Medicaid Other | Admitting: Family

## 2022-09-16 ENCOUNTER — Emergency Department (HOSPITAL_COMMUNITY): Payer: Medicaid Other

## 2022-09-16 DIAGNOSIS — I2510 Atherosclerotic heart disease of native coronary artery without angina pectoris: Secondary | ICD-10-CM | POA: Insufficient documentation

## 2022-09-16 DIAGNOSIS — D72829 Elevated white blood cell count, unspecified: Secondary | ICD-10-CM | POA: Diagnosis not present

## 2022-09-16 DIAGNOSIS — R079 Chest pain, unspecified: Principal | ICD-10-CM | POA: Diagnosis present

## 2022-09-16 DIAGNOSIS — Z79899 Other long term (current) drug therapy: Secondary | ICD-10-CM | POA: Diagnosis not present

## 2022-09-16 DIAGNOSIS — R0789 Other chest pain: Secondary | ICD-10-CM | POA: Diagnosis not present

## 2022-09-16 DIAGNOSIS — M545 Low back pain, unspecified: Secondary | ICD-10-CM | POA: Diagnosis not present

## 2022-09-16 DIAGNOSIS — T781XXA Other adverse food reactions, not elsewhere classified, initial encounter: Secondary | ICD-10-CM | POA: Diagnosis not present

## 2022-09-16 DIAGNOSIS — F17210 Nicotine dependence, cigarettes, uncomplicated: Secondary | ICD-10-CM | POA: Diagnosis not present

## 2022-09-16 DIAGNOSIS — R7989 Other specified abnormal findings of blood chemistry: Secondary | ICD-10-CM | POA: Diagnosis not present

## 2022-09-16 DIAGNOSIS — T7840XA Allergy, unspecified, initial encounter: Secondary | ICD-10-CM | POA: Insufficient documentation

## 2022-09-16 DIAGNOSIS — T782XXA Anaphylactic shock, unspecified, initial encounter: Secondary | ICD-10-CM | POA: Diagnosis not present

## 2022-09-16 DIAGNOSIS — M5459 Other low back pain: Secondary | ICD-10-CM | POA: Insufficient documentation

## 2022-09-16 DIAGNOSIS — Z7982 Long term (current) use of aspirin: Secondary | ICD-10-CM | POA: Insufficient documentation

## 2022-09-16 DIAGNOSIS — R112 Nausea with vomiting, unspecified: Secondary | ICD-10-CM | POA: Diagnosis not present

## 2022-09-16 DIAGNOSIS — Z720 Tobacco use: Secondary | ICD-10-CM

## 2022-09-16 DIAGNOSIS — R778 Other specified abnormalities of plasma proteins: Secondary | ICD-10-CM | POA: Diagnosis not present

## 2022-09-16 DIAGNOSIS — M549 Dorsalgia, unspecified: Secondary | ICD-10-CM | POA: Diagnosis present

## 2022-09-16 DIAGNOSIS — L509 Urticaria, unspecified: Secondary | ICD-10-CM | POA: Diagnosis not present

## 2022-09-16 DIAGNOSIS — R001 Bradycardia, unspecified: Secondary | ICD-10-CM

## 2022-09-16 LAB — CBC
HCT: 44.5 % (ref 39.0–52.0)
Hemoglobin: 15.2 g/dL (ref 13.0–17.0)
MCH: 30.3 pg (ref 26.0–34.0)
MCHC: 34.2 g/dL (ref 30.0–36.0)
MCV: 88.8 fL (ref 80.0–100.0)
Platelets: 273 10*3/uL (ref 150–400)
RBC: 5.01 MIL/uL (ref 4.22–5.81)
RDW: 14.3 % (ref 11.5–15.5)
WBC: 15.3 10*3/uL — ABNORMAL HIGH (ref 4.0–10.5)
nRBC: 0 % (ref 0.0–0.2)

## 2022-09-16 LAB — BASIC METABOLIC PANEL
Anion gap: 11 (ref 5–15)
BUN: 17 mg/dL (ref 6–20)
CO2: 23 mmol/L (ref 22–32)
Calcium: 9.1 mg/dL (ref 8.9–10.3)
Chloride: 104 mmol/L (ref 98–111)
Creatinine, Ser: 0.97 mg/dL (ref 0.61–1.24)
GFR, Estimated: >60 mL/min (ref >60)
Glucose, Bld: 112 mg/dL — ABNORMAL HIGH (ref 70–99)
Potassium: 3.7 mmol/L (ref 3.5–5.1)
Sodium: 138 mmol/L (ref 135–145)

## 2022-09-16 LAB — TROPONIN I (HIGH SENSITIVITY): Troponin I (High Sensitivity): 7 ng/L (ref <18)

## 2022-09-16 MED ORDER — BACLOFEN 10 MG PO TABS: 10.0000 mg | ORAL_TABLET | Freq: Three times a day (TID) | ORAL | 0 refills | Status: DC

## 2022-09-16 MED ORDER — FAMOTIDINE IN NACL 20-0.9 MG/50ML-% IV SOLN
20.0000 mg | Freq: Once | INTRAVENOUS | Status: AC
  Administered 2022-09-16: 20 mg via INTRAVENOUS
  Filled 2022-09-16: qty 50

## 2022-09-16 MED ORDER — EPINEPHRINE 0.3 MG/0.3ML IJ SOAJ: 0.3000 mg | INTRAMUSCULAR | 0 refills | Status: DC | PRN

## 2022-09-16 MED ORDER — METHYLPREDNISOLONE 4 MG PO TBPK: ORAL_TABLET | ORAL | 0 refills | Status: DC

## 2022-09-16 MED ORDER — SODIUM CHLORIDE 0.9 % IV BOLUS
500.0000 mL | Freq: Once | INTRAVENOUS | Status: AC
  Administered 2022-09-16: 500 mL via INTRAVENOUS

## 2022-09-16 MED ORDER — METHYLPREDNISOLONE SODIUM SUCC 125 MG IJ SOLR
125.0000 mg | INTRAMUSCULAR | Status: AC
  Administered 2022-09-16: 125 mg via INTRAVENOUS
  Filled 2022-09-16: qty 2

## 2022-09-16 NOTE — ED Provider Notes (Signed)
EMERGENCY DEPARTMENT AT [facility] Provider Note   CSN: [id number] Arrival date & time: 09/16/22  0141     History  Chief Complaint  Patient presents with   Allergic Reaction    Edward Schroeder is a 42 y.o. male.  Patient brought to the emergency department for evaluation of allergic reaction.  Patient reports that he broke out in diffuse hives approximately an hour ago.  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  He ate a sub at a restaurant prior to onset of symptoms.  Patient took 75 mg of Benadryl when the hives began.  He reports that the itching is better and EMS reports that the hives have been fading during transport.  No tongue swelling, throat swelling, difficulty swallowing or shortness of breath.       Home Medications Prior to Admission medications   Medication Sig Start Date End Date Taking? Authorizing Provider  EPINEPHrine 0.3 mg/0.3 mL IJ SOAJ injection Inject 0.3 mg into the muscle as needed for anaphylaxis. 09/16/22  Yes Gilda Crease, MD  methylPREDNISolone (MEDROL DOSEPAK) 4 MG TBPK tablet As directed 09/16/22  Yes Latrina Guttman, Canary Brim, MD  aspirin 81 MG chewable tablet Chew 81 mg by mouth once. 02/03/22   [provider]  naproxen (NAPROSYN) 500 MG tablet Take 1 tablet (500 mg total) by mouth 2 (two) times daily. 09/15/22   Geoffery Lyons, MD  traMADol (ULTRAM) 50 MG tablet Take 1 tablet (50 mg total) by mouth every 6 (six) hours as needed. 09/15/22   Geoffery Lyons, MD      Allergies    Shellfish allergy and Pork-derived products    Review of Systems   Review of Systems  Physical Exam Updated Vital Signs BP 110/67   Pulse 67   Temp 97.6 F (36.4 C) (Oral)   Resp 20   Ht 5\' 10"  (1.778 m)   Wt 89 kg   SpO2 96%   BMI 28.15 kg/m  Physical Exam Vitals and nursing note reviewed.  Constitutional:      General: He is not in acute distress.    Appearance: He is well-developed.  HENT:     Head: Normocephalic and  atraumatic.     Mouth/Throat:     Mouth: Mucous membranes are moist.  Eyes:     General: Vision grossly intact. Gaze aligned appropriately.     Extraocular Movements: Extraocular movements intact.     Conjunctiva/sclera: Conjunctivae normal.  Cardiovascular:     Rate and Rhythm: Normal rate and regular rhythm.     Pulses: Normal pulses.     Heart sounds: Normal heart sounds, S1 normal and S2 normal. No murmur heard.    No friction rub. No gallop.  Pulmonary:     Effort: Pulmonary effort is normal. No respiratory distress.     Breath sounds: Normal breath sounds.  Abdominal:     Palpations: Abdomen is soft.     Tenderness: There is no abdominal tenderness. There is no guarding or rebound.     Hernia: No hernia is present.  Musculoskeletal:        General: No swelling.     Cervical back: Full passive range of motion without pain, normal range of motion and neck supple. No pain with movement, spinous process tenderness or muscular tenderness. Normal range of motion.     Right lower leg: No edema.     Left lower leg: No edema.  Skin:    General: Skin is warm  and dry.     Capillary Refill: Capillary refill takes less than 2 seconds.     Findings: Rash (Diffuse urticaria) present. No ecchymosis, erythema, lesion or wound.  Neurological:     Mental Status: He is alert and oriented to person, place, and time.     GCS: GCS eye subscore is 4. GCS verbal subscore is 5. GCS motor subscore is 6.     Cranial Nerves: Cranial nerves 2-12 are intact.     Sensory: Sensation is intact.     Motor: Motor function is intact. No weakness or abnormal muscle tone.     Coordination: Coordination is intact.  Psychiatric:        Mood and Affect: Mood normal.        Speech: Speech normal.        Behavior: Behavior normal.     ED Results / Procedures / Treatments   Labs (all labs ordered are listed, but only abnormal results are displayed) Labs Reviewed - No data to  display  EKG None  Radiology No results found.  Procedures Procedures    Medications Ordered in ED Medications  famotidine (PEPCID) IVPB 20 mg premix (0 mg Intravenous Stopped 09/16/22 0220)  methylPREDNISolone sodium succinate (SOLU-MEDROL) 125 mg/2 mL injection 125 mg (125 mg Intravenous Given 09/16/22 0151)  sodium chloride 0.9 % bolus 500 mL (0 mLs Intravenous Stopped 09/16/22 0251)    ED Course/ Medical Decision Making/ A&P                             Medical Decision Making Risk Prescription drug management.   Patient presents to the emergency department for evaluation of acute allergic reaction.  Patient has multiple food allergies.  Patient arrives with diffuse hives.  Vital signs normal, no signs of anaphylaxis.  Airway without involvement.  Patient had already taken Benadryl.  He was given Pepcid and Solu-Medrol and monitored.  Hives have resolved, he is doing well now.  Will discharge with short course of prednisone, scheduled dose of antihistamine.  Will give prescription for EpiPen.        Final Clinical Impression(s) / ED Diagnoses Final diagnoses:  Allergic reaction, initial encounter    Rx / DC Orders ED Discharge Orders          Ordered    EPINEPHrine 0.3 mg/0.3 mL IJ SOAJ injection  As needed        09/16/22 0453    methylPREDNISolone (MEDROL DOSEPAK) 4 MG TBPK tablet        09/16/22 0453              Gilda Crease, MD 09/16/22 [phone]

## 2022-09-16 NOTE — ED Triage Notes (Addendum)
Pt arrived via [facility] from home with c/c of Chest Pain. Per EMS pt was walking around house when he had sudden onset of chest tightness described of fist with glass. Pt has hx of MI. Denies dizziness. EMS stated pt took ASA and when they arrived Chest Pain was gone. Pt endorses feels same as previous MI. Pt has back pain since Friday.  324 ASA CBG 118, 140/88, 76-64HR, 95% RA

## 2022-09-16 NOTE — Progress Notes (Signed)
Virtual Visit Consent   Edward Schroeder, you are scheduled for a virtual visit with a [facility] provider today. Just as with appointments in the office, your consent must be obtained to participate. Your consent will be active for this visit and any virtual visit you may have with one of our providers in the next 365 days. If you have a MyChart account, a copy of this consent can be sent to you electronically.  As this is a virtual visit, video technology does not allow for your provider to perform a traditional examination. This may limit your provider's ability to fully assess your condition. If your provider identifies any concerns that need to be evaluated in person or the need to arrange testing (such as labs, EKG, etc.), we will make arrangements to do so. Although advances in technology are sophisticated, we cannot ensure that it will always work on either your end or our end. If the connection with a video visit is poor, the visit may have to be switched to a telephone visit. With either a video or telephone visit, we are not always able to ensure that we have a secure connection.  By engaging in this virtual visit, you consent to the provision of healthcare and authorize for your insurance to be billed (if applicable) for the services provided during this visit. Depending on your insurance coverage, you may receive a charge related to this service.  I need to obtain your verbal consent now. Are you willing to proceed with your visit today? Edward Schroeder has provided verbal consent on 09/16/2022 for a virtual visit (video or telephone). Jannifer Rodney, FNP  Date: 09/16/2022 6:04 PM  Virtual Visit via Video Note   I, Jannifer Rodney, connected with  Edward Schroeder  ([id number], 01-Dec-1980) on 09/16/22 at  6:00 PM EDT by a video-enabled telemedicine application and verified that I am speaking with the correct person using two identifiers.  Location: Patient: Virtual Visit Location Patient:  Home Provider: Virtual Visit Location Provider: Home Office   I discussed the limitations of evaluation and management by telemedicine and the availability of in person appointments. The patient expressed understanding and agreed to proceed.    History of Present Illness: Edward Schroeder is a 42 y.o. who identifies as a male who was assigned male at birth, and is being seen today for back pain. He was seen in the ED on Friday and given Ultram and naprosyn. Reports this has not helped with his pain. He went today to the ED for an allergic reaction for hives. He was given a steroid injection and rx of medrol dose pack. He has not pick this up yet.   HPI: Back Pain This is a new problem. The current episode started in the past 7 days. The problem occurs intermittently. The problem has been waxing and waning since onset. The pain is present in the lumbar spine. The quality of the pain is described as aching. The pain is at a severity of 2/10 (when standing his pain is 10  out 10). The pain is moderate.    Problems:  Patient Active Problem List   Diagnosis Date Noted   Chest pain 03/18/2022   Elevated troponin    Precordial pain    Coronary artery disease involving native coronary artery of native heart    Influenza    Fever in adult 06/03/2016   Headache 06/03/2016   Acute back pain     Allergies:  Allergies  Allergen Reactions   Shellfish  Allergy Hives   Pork-Derived Products Hives   Medications:  Current Outpatient Medications:    baclofen (LIORESAL) 10 MG tablet, Take 1 tablet (10 mg total) by mouth 3 (three) times daily., Disp: 30 each, Rfl: 0   aspirin 81 MG chewable tablet, Chew 81 mg by mouth once., Disp: , Rfl:    EPINEPHrine 0.3 mg/0.3 mL IJ SOAJ injection, Inject 0.3 mg into the muscle as needed for anaphylaxis., Disp: 1 each, Rfl: 0   methylPREDNISolone (MEDROL DOSEPAK) 4 MG TBPK tablet, As directed, Disp: 21 tablet, Rfl: 0   naproxen (NAPROSYN) 500 MG tablet, Take 1 tablet  (500 mg total) by mouth 2 (two) times daily., Disp: 20 tablet, Rfl: 0   traMADol (ULTRAM) 50 MG tablet, Take 1 tablet (50 mg total) by mouth every 6 (six) hours as needed., Disp: 20 tablet, Rfl: 0  Observations/Objective: Patient is well-developed, well-nourished in no acute distress.  Resting comfortably  at home.  Head is normocephalic, atraumatic.  No labored breathing. Speech is clear and coherent with logical content.  Patient is alert and oriented at baseline.    Assessment and Plan: 1. Acute bilateral low back pain without sciatica - baclofen (LIORESAL) 10 MG tablet; Take 1 tablet (10 mg total) by mouth 3 (three) times daily.  Dispense: 30 each; Refill: 0  Rest Continue naproxen and Ultram  Start medrol dose pack  ROM exercises  Follow up if symptoms worsen or do not improve   Follow Up Instructions: I discussed the assessment and treatment plan with the patient. The patient was provided an opportunity to ask questions and all were answered. The patient agreed with the plan and demonstrated an understanding of the instructions.  A copy of instructions were sent to the patient via MyChart unless otherwise noted below.     The patient was advised to call back or seek an in-person evaluation if the symptoms worsen or if the condition fails to improve as anticipated.  Time:  I spent 8 minutes with the patient via telehealth technology discussing the above problems/concerns.    Jannifer Rodney, FNP

## 2022-09-16 NOTE — ED Triage Notes (Signed)
BIB EMS for allergic reaction for 1 hr. Hives to arms, chest, abd. Allergic to pork and shellfish. At sub restaurant prior to reaction.  75 mg PO benadryl taken by patient.   18g LAC 2L [place]

## 2022-09-16 NOTE — Discharge Instructions (Signed)
Call 911 if you have any symptoms of tongue swelling, throat swelling, difficulty swallowing or shortness of breath.  Take Benadryl 1 to 2 tablets every 6 hours for the next 2 days on a scheduled dose.  Take the entire course of steroid as prescribed.

## 2022-09-17 ENCOUNTER — Observation Stay (HOSPITAL_BASED_OUTPATIENT_CLINIC_OR_DEPARTMENT_OTHER): Payer: Medicaid Other

## 2022-09-17 DIAGNOSIS — R001 Bradycardia, unspecified: Secondary | ICD-10-CM

## 2022-09-17 DIAGNOSIS — D72829 Elevated white blood cell count, unspecified: Secondary | ICD-10-CM

## 2022-09-17 DIAGNOSIS — R079 Chest pain, unspecified: Secondary | ICD-10-CM | POA: Diagnosis not present

## 2022-09-17 DIAGNOSIS — Z720 Tobacco use: Secondary | ICD-10-CM

## 2022-09-17 LAB — ECHOCARDIOGRAM COMPLETE
AR max vel: 3.09 cm2
AV Peak grad: 8.3 mmHg
Ao pk vel: 1.44 m/s
Area-P 1/2: 3.99 cm2
Height: 70 in
S' Lateral: 3.5 cm
Weight: 3168 oz

## 2022-09-17 LAB — TROPONIN I (HIGH SENSITIVITY)
Troponin I (High Sensitivity): 23 ng/L — ABNORMAL HIGH (ref <18)
Troponin I (High Sensitivity): 21 ng/L — ABNORMAL HIGH (ref <18)

## 2022-09-17 LAB — CBC
HCT: 42.2 % (ref 39.0–52.0)
Hemoglobin: 14.7 g/dL (ref 13.0–17.0)
MCH: 30.4 pg (ref 26.0–34.0)
MCHC: 34.8 g/dL (ref 30.0–36.0)
MCV: 87.4 fL (ref 80.0–100.0)
Platelets: 254 10*3/uL (ref 150–400)
RBC: 4.83 MIL/uL (ref 4.22–5.81)
RDW: 14.6 % (ref 11.5–15.5)
WBC: 12.7 10*3/uL — ABNORMAL HIGH (ref 4.0–10.5)
nRBC: 0 % (ref 0.0–0.2)

## 2022-09-17 LAB — RAPID URINE DRUG SCREEN, HOSP PERFORMED
Amphetamines: NOT DETECTED
Barbiturates: NOT DETECTED
Benzodiazepines: NOT DETECTED
Cocaine: NOT DETECTED
Opiates: NOT DETECTED
Tetrahydrocannabinol: POSITIVE — AB

## 2022-09-17 LAB — HEPATIC FUNCTION PANEL
ALT: 26 U/L (ref 0–44)
AST: 29 U/L (ref 15–41)
Albumin: 3.4 g/dL — ABNORMAL LOW (ref 3.5–5.0)
Alkaline Phosphatase: 75 U/L (ref 38–126)
Bilirubin, Direct: 0.1 mg/dL (ref 0.0–0.2)
Indirect Bilirubin: 0.4 mg/dL (ref 0.3–0.9)
Total Bilirubin: 0.5 mg/dL (ref 0.3–1.2)
Total Protein: 6 g/dL — ABNORMAL LOW (ref 6.5–8.1)

## 2022-09-17 LAB — LIPASE, BLOOD: Lipase: 32 U/L (ref 11–51)

## 2022-09-17 MED ORDER — TRAMADOL HCL 50 MG PO TABS
50.0000 mg | ORAL_TABLET | Freq: Four times a day (QID) | ORAL | Status: DC | PRN
  Filled 2022-09-17: qty 1

## 2022-09-17 MED ORDER — NAPROXEN 250 MG PO TABS
500.0000 mg | ORAL_TABLET | Freq: Two times a day (BID) | ORAL | Status: DC
  Administered 2022-09-17: 500 mg via ORAL
  Filled 2022-09-17 (×3): qty 2

## 2022-09-17 MED ORDER — NICOTINE 21 MG/24HR TD PT24
21.0000 mg | MEDICATED_PATCH | Freq: Every day | TRANSDERMAL | Status: DC
  Administered 2022-09-17: 21 mg via TRANSDERMAL
  Filled 2022-09-17 (×2): qty 1

## 2022-09-17 MED ORDER — BACLOFEN 10 MG PO TABS
10.0000 mg | ORAL_TABLET | Freq: Three times a day (TID) | ORAL | Status: DC
  Administered 2022-09-17 (×2): 10 mg via ORAL
  Filled 2022-09-17 (×3): qty 1

## 2022-09-17 NOTE — ED Provider Notes (Signed)
[facility]EMERGENCY DEPT [facility] Emergency Department Provider Note MRN:  [id number]  Arrival date & time: 09/17/22     Chief Complaint   Chest Pain and Back Pain   History of Present Illness   Edward Schroeder is a 42 y.o. year-old male presents to the ED with chief complaint of chest pain.  Onset today while walking around his house.  Hx of prior MI with negative heart cath.  States that he took 324 of ASA.  States that symptoms lasted a total of about 9 minutes.  Denies symptoms now.   History provided by patient.   Review of Systems  Pertinent positive and negative review of systems noted in HPI.    Physical Exam   Vitals:   09/16/22 2206 09/17/22 0130  BP: 125/73 121/75  Pulse: 72 (!) 54  Resp: 18 17  Temp: 98.3 F (36.8 C)   SpO2: 99% 98%    CONSTITUTIONAL:  well-appearing, NAD NEURO:  Alert and oriented x 3, CN 3-12 grossly intact EYES:  eyes equal and reactive ENT/NECK:  Supple, no stridor  CARDIO:  normal rate, regular rhythm, appears well-perfused  PULM:  No respiratory distress,  GI/GU:  non-distended,  MSK/SPINE:  No gross deformities, no edema, moves all extremities  SKIN:  no rash, atraumatic   *Additional and/or pertinent findings included in MDM below  Diagnostic and Interventional Summary    EKG Interpretation  Date/Time:    Ventricular Rate:    PR Interval:    QRS Duration:   QT Interval:    QTC Calculation:   R Axis:     Text Interpretation:         Labs Reviewed  BASIC METABOLIC PANEL - Abnormal; Notable for the following components:      Result Value   Glucose, Bld 112 (*)    All other components within normal limits  CBC - Abnormal; Notable for the following components:   WBC 15.3 (*)    All other components within normal limits  TROPONIN I (HIGH SENSITIVITY) - Abnormal; Notable for the following components:   Troponin I (High Sensitivity) 23 (*)    All other components within normal limits  RAPID URINE DRUG SCREEN, HOSP  PERFORMED  HEPATIC FUNCTION PANEL  TROPONIN I (HIGH SENSITIVITY)    DG Chest 2 View  Final Result      Medications - No data to display   Procedures  /  Critical Care .Critical Care  Performed by: Roxy Horseman, PA-C Authorized by: Roxy Horseman, PA-C   Critical care provider statement:    Critical care time (minutes):  33   Critical care was necessary to treat or prevent imminent or life-threatening deterioration of the following conditions:  Circulatory failure   Critical care was time spent personally by me on the following activities:  Development of treatment plan with patient or surrogate, discussions with consultants, evaluation of patient's response to treatment, examination of patient, ordering and review of laboratory studies, ordering and review of radiographic studies, ordering and performing treatments and interventions, pulse oximetry, re-evaluation of patient's condition and review of old charts   ED Course and Medical Decision Making  I have reviewed the triage vital signs, the nursing notes, and pertinent available records from the EMR.  Social Determinants Affecting Complexity of Care: Patient has no clinically significant social determinants affecting this chief complaint..   ED Course:    Medical Decision Making Patient here with chest pain.  Onset today.  Resolved now.  Hx of prior  MI.  Amount and/or Complexity of Data Reviewed Labs: ordered.    Details: Up trending trop 7->23 Radiology: ordered and independent interpretation performed.    Details: No obvious abnormality ECG/medicine tests: ordered and independent interpretation performed.    Details: Sinus brady without acute ischemic changes  Risk Decision regarding hospitalization.     Consultants: I discussed the case with Dr. Jayme Cloud from cardiology, who recommends hospitalist admission and echo in the morning.  Cards consult if echo is abnormal.  I consulted with Dr. Loney Loh, who is  appreciated for admitting.   Treatment and Plan: Patient's exam and diagnostic results are concerning for elevated trop with chest pain.  Feel that patient will need admission to the hospital for further treatment and evaluation.    Final Clinical Impressions(s) / ED Diagnoses     ICD-10-CM   1. Chest pain, unspecified type  R07.9     2. Elevated troponin  R79.89       ED Discharge Orders     None         Discharge Instructions Discussed with and Provided to Patient:   Discharge Instructions   None      Roxy Horseman, PA-C 09/17/22 0206    Nira Conn, MD 09/17/22 1910

## 2022-09-17 NOTE — Progress Notes (Signed)
Echocardiogram 2D Echocardiogram has been performed.  Lucendia Herrlich 09/17/2022, 12:13 PM

## 2022-09-17 NOTE — Care Plan (Signed)
This 42 years old Male with PMH significant for CAD, hyperlipidemia, sinus bradycardia, marijuana use, tobacco abuse presented in the ED with chest pain, describes it as severe squeezing/  non-radiating located on the left side.  He has taken 4 tablets of aspirin and chest pain resolved after 10 minutes prior to EMS arrival.  Patient did play basketball the day prior and denies any injury to his chest or pulling a muscle.  Patient report he did have a left heart catheterization in May 2023 in [place] and was told that he did not have any blockages.  Troponin 7> 23.  EG without ischemic changes. PE less likely as patient has no tachycardia or hypoxia.  CTA was done in May 23 shows minimal nonobstructive CAD.  Echocardiogram unremarkable.  Patient is not on any AV nodal blocking medications.  Patient was seen and examined at bedside.

## 2022-09-17 NOTE — Discharge Summary (Signed)
Physician Discharge Summary  Edward Schroeder WUJ:[id number] DOB: 06/04/1980 DOA: 09/16/2022  PCP: Pcp, No  Admit date: 09/16/2022  Discharge date: 09/17/2022  Admitted From: Home Disposition:  Home  Recommendations for Outpatient Follow-up:  Follow up with PCP in 1-2 weeks Please obtain BMP/CBC in one week 3.   Echocardiogram is normal.  Patient has appointment with PCP tomorrow. 4.   Patient needs outpatient stress test.  Home Health:None Equipment/Devices:None  Discharge Condition: Stable CODE STATUS:Full code Diet recommendation: Heart Healthy   Brief [facility] Course: This 42 years old Male with PMH significant for CAD, hyperlipidemia, sinus bradycardia, marijuana use, tobacco abuse presented in the ED with chest pain, describes it as severe squeezing /  non-radiating located on the left side.  He has taken 4 tablets of aspirin and chest pain resolved after 10 minutes prior to EMS arrival.  Patient did play basketball the day prior and denies any injury to his chest or pulling a muscle.  Patient report he did have a left heart catheterization in May 2023 in [place] and was told that he did not have any blockages.  Troponin 7> 23.  EG without ischemic changes. PE less likely as patient has no tachycardia or hypoxia.  CTA was done in May 23 shows minimal nonobstructive CAD.  Echocardiogram unremarkable.  Patient is not on any AV nodal blocking medications.  Patient feels better and wants to be discharged.  Chest pain less likely related to ACS.  Patient has appointment with primary care physician tomorrow, He states he will get referral for cardiology for stress test.  Patient is being discharged home.  Chest pain has completely resolved.   Discharge Diagnoses:  Principal Problem:   Chest pain Active Problems:   Acute back pain   Sinus bradycardia   Leukocytosis   Tobacco abuse   Discharge Instructions  Discharge Instructions     Call MD for:  difficulty breathing,  headache or visual disturbances   Complete by: As directed    Call MD for:  persistant dizziness or light-headedness   Complete by: As directed    Call MD for:  persistant nausea and vomiting   Complete by: As directed    Diet - low sodium heart healthy   Complete by: As directed    Diet Carb Modified   Complete by: As directed    Discharge instructions   Complete by: As directed    Advised to follow-up with primary care physician in 1 week. Echocardiogram is normal.  Patient has appointment with PCP tomorrow. Patient outpatient stress test.   Increase activity slowly   Complete by: As directed       Allergies as of 09/17/2022       Reactions   Shellfish Allergy Hives   Pork-derived Products Hives        Medication List     STOP taking these medications    baclofen 10 MG tablet Commonly known as: LIORESAL   EPINEPHrine 0.3 mg/0.3 mL Soaj injection Commonly known as: EPI-PEN   methylPREDNISolone 4 MG Tbpk tablet Commonly known as: MEDROL DOSEPAK       TAKE these medications    aspirin 81 MG chewable tablet Chew 81 mg by mouth once.   naproxen 500 MG tablet Commonly known as: NAPROSYN Take 1 tablet (500 mg total) by mouth 2 (two) times daily.   traMADol 50 MG tablet Commonly known as: ULTRAM Take 1 tablet (50 mg total) by mouth every 6 (six) hours as needed.  Allergies  Allergen Reactions   Shellfish Allergy Hives   Pork-Derived Products Hives    Consultations: None   Procedures/Studies: ECHOCARDIOGRAM COMPLETE  Result Date: 09/17/2022    ECHOCARDIOGRAM REPORT   Patient Name:   Edward Schroeder Date of Exam: 09/17/2022 Medical Rec #:  [id number]     Height:       70.0 in Accession #:    [id number]    Weight:       198.0 lb Date of Birth:  04/15/1981    BSA:          2.078 m Patient Age:    41 years      BP:           126/74 mmHg Patient Gender: M             HR:           51 bpm. Exam Location:  Inpatient Procedure: 2D Echo, Cardiac Doppler  and Color Doppler Indications:    Chest Pain R07.9  History:        Patient has prior history of Echocardiogram examinations, most                 recent 03/18/2022. CAD, Arrythmias:Bradycardia,                 Signs/Symptoms:Chest Pain; Risk Factors:Current Smoker.  Sonographer:    Lucendia Herrlich Referring Phys: [id number] VASUNDHRA RATHORE IMPRESSIONS  1. Left ventricular ejection fraction, by estimation, is 55 to 60%. The left ventricle has normal function. The left ventricle has no regional wall motion abnormalities. Left ventricular diastolic parameters were normal.  2. Right ventricular systolic function is normal. The right ventricular size is normal.  3. The mitral valve is normal in structure. No evidence of mitral valve regurgitation. No evidence of mitral stenosis.  4. The aortic valve is tricuspid. Aortic valve regurgitation is not visualized. No aortic stenosis is present.  5. The inferior vena cava is dilated in size with >50% respiratory variability, suggesting right atrial pressure of 8 mmHg. FINDINGS  Left Ventricle: Left ventricular ejection fraction, by estimation, is 55 to 60%. The left ventricle has normal function. The left ventricle has no regional wall motion abnormalities. The left ventricular internal cavity size was normal in size. There is  no left ventricular hypertrophy. Left ventricular diastolic parameters were normal. Right Ventricle: The right ventricular size is normal. Right ventricular systolic function is normal. Left Atrium: Left atrial size was normal in size. Right Atrium: Right atrial size was normal in size. Pericardium: There is no evidence of pericardial effusion. Mitral Valve: The mitral valve is normal in structure. No evidence of mitral valve regurgitation. No evidence of mitral valve stenosis. Tricuspid Valve: The tricuspid valve is normal in structure. Tricuspid valve regurgitation is trivial. No evidence of tricuspid stenosis. Aortic Valve: The aortic valve is  tricuspid. Aortic valve regurgitation is not visualized. No aortic stenosis is present. Aortic valve peak gradient measures 8.3 mmHg. Pulmonic Valve: The pulmonic valve was normal in structure. Pulmonic valve regurgitation is not visualized. No evidence of pulmonic stenosis. Aorta: The aortic root is normal in size and structure. Venous: The inferior vena cava is dilated in size with greater than 50% respiratory variability, suggesting right atrial pressure of 8 mmHg. IAS/Shunts: No atrial level shunt detected by color flow Doppler.  LEFT VENTRICLE PLAX 2D LVIDd:         5.10 cm   Diastology LVIDs:         3.50  cm   LV e' medial:    10.40 cm/s LV PW:         1.00 cm   LV E/e' medial:  8.6 LV IVS:        1.00 cm   LV e' lateral:   22.70 cm/s LVOT diam:     2.10 cm   LV E/e' lateral: 3.9 LV SV:         95 LV SV Index:   46 LVOT Area:     3.46 cm  RIGHT VENTRICLE            IVC RV S prime:     9.95 cm/s  IVC diam: 2.40 cm TAPSE (M-mode): 1.7 cm LEFT ATRIUM             Index        RIGHT ATRIUM           Index LA diam:        4.10 cm 1.97 cm/m   RA Area:     16.60 cm LA Vol (A2C):   50.2 ml 24.15 ml/m  RA Volume:   45.90 ml  22.08 ml/m LA Vol (A4C):   54.4 ml 26.17 ml/m LA Biplane Vol: 55.1 ml 26.51 ml/m  AORTIC VALVE                 PULMONIC VALVE AV Area (Vmax): 3.09 cm     PR End Diast Vel: 3.53 msec AV Vmax:        144.00 cm/s AV Peak Grad:   8.3 mmHg LVOT Vmax:      128.67 cm/s LVOT Vmean:     79.833 cm/s LVOT VTI:       0.274 m  AORTA Ao Root diam: 3.40 cm Ao Asc diam:  3.30 cm MITRAL VALVE               TRICUSPID VALVE MV Area (PHT): 3.99 cm    TR Peak grad:   19.0 mmHg MV Decel Time: 190 msec    TR Vmax:        218.00 cm/s MV E velocity: 89.40 cm/s MV A velocity: 45.30 cm/s  SHUNTS MV E/A ratio:  1.97        Systemic VTI:  0.27 m                            Systemic Diam: 2.10 cm Olga Millers MD Electronically signed by Olga Millers MD Signature Date/Time: 09/17/2022/1:03:11 PM    Final    DG Chest  2 View  Result Date: 09/16/2022 CLINICAL DATA:  Left-sided chest pain.  Heart attack last March. EXAM: CHEST - 2 VIEW COMPARISON:  03/18/2022. FINDINGS: The heart size and mediastinal contours are within normal limits. No consolidation, effusion, or pneumothorax. No acute osseous abnormality. IMPRESSION: No active cardiopulmonary disease. Electronically Signed   By: Thornell Sartorius M.D.   On: 09/16/2022 22:43     Subjective: She was seen and examined at bedside.  Overnight events noted.   Patient reports doing much better and wants to be discharged. Chest pain resolved.  Discharge Exam: Vitals:   09/17/22 0345 09/17/22 0829  BP: 126/74 124/81  Pulse:  61  Resp: 16 16  Temp: (!) 97.4 F (36.3 C) 97.9 F (36.6 C)  SpO2:     Vitals:   09/17/22 0130 09/17/22 0327 09/17/22 0345 09/17/22 0829  BP: 121/75 (!) 170/93 126/74 124/81  Pulse: (!) 54  70  61  Resp: 17 15 16 16   Temp:   (!) 97.4 F (36.3 C) 97.9 F (36.6 C)  TempSrc:   Oral Oral  SpO2: 98% 97%    Weight:      Height:        General: Pt is alert, awake, not in acute distress Cardiovascular: RRR, S1/S2 +, no rubs, no gallops Respiratory: CTA bilaterally, no wheezing, no rhonchi Abdominal: Soft, NT, ND, bowel sounds + Extremities: no edema, no cyanosis    The results of significant diagnostics from this hospitalization (including imaging, microbiology, ancillary and laboratory) are listed below for reference.     Microbiology: No results found for this or any previous visit (from the past 240 hour(s)).   Labs: BNP (last 3 results) No results for input(s): "BNP" in the last 8760 hours. Basic Metabolic Panel: Recent Labs  Lab 09/16/22 2230  NA 138  K 3.7  CL 104  CO2 23  GLUCOSE 112*  BUN 17  CREATININE 0.97  CALCIUM 9.1   Liver Function Tests: Recent Labs  Lab 09/17/22 0541  AST 29  ALT 26  ALKPHOS 75  BILITOT 0.5  PROT 6.0*  ALBUMIN 3.4*   Recent Labs  Lab 09/17/22 0541  LIPASE 32   No  results for input(s): "AMMONIA" in the last 168 hours. CBC: Recent Labs  Lab 09/16/22 2230 09/17/22 0541  WBC 15.3* 12.7*  HGB 15.2 14.7  HCT 44.5 42.2  MCV 88.8 87.4  PLT 273 254   Cardiac Enzymes: No results for input(s): "CKTOTAL", "CKMB", "CKMBINDEX", "TROPONINI" in the last 168 hours. BNP: Invalid input(s): "POCBNP" CBG: No results for input(s): "GLUCAP" in the last 168 hours. D-Dimer No results for input(s): "DDIMER" in the last 72 hours. Hgb A1c No results for input(s): "HGBA1C" in the last 72 hours. Lipid Profile No results for input(s): "CHOL", "HDL", "LDLCALC", "TRIG", "CHOLHDL", "LDLDIRECT" in the last 72 hours. Thyroid function studies No results for input(s): "TSH", "T4TOTAL", "T3FREE", "THYROIDAB" in the last 72 hours.  Invalid input(s): "FREET3" Anemia work up No results for input(s): "VITAMINB12", "FOLATE", "FERRITIN", "TIBC", "IRON", "RETICCTPCT" in the last 72 hours. Urinalysis    Component Value Date/Time   COLORURINE YELLOW 06/03/2016 1350   APPEARANCEUR CLEAR 06/03/2016 1350   LABSPEC 1.032 (H) 06/03/2016 1350   PHURINE 7.0 06/03/2016 1350   GLUCOSEU NEGATIVE 06/03/2016 1350   HGBUR NEGATIVE 06/03/2016 1350   BILIRUBINUR NEGATIVE 06/03/2016 1350   KETONESUR NEGATIVE 06/03/2016 1350   PROTEINUR NEGATIVE 06/03/2016 1350   NITRITE NEGATIVE 06/03/2016 1350   LEUKOCYTESUR NEGATIVE 06/03/2016 1350   Sepsis Labs Recent Labs  Lab 09/16/22 2230 09/17/22 0541  WBC 15.3* 12.7*   Microbiology No results found for this or any previous visit (from the past 240 hour(s)).   Time coordinating discharge: Over 30 minutes  SIGNED:   Willeen Niece, MD  [facility] 09/17/2022, 2:15 PM Pager   If 7PM-7AM, please contact night-coverage

## 2022-09-17 NOTE — H&P (Signed)
History and Physical    Edward Schroeder ZOX:[id number] DOB: [birth year] DOA: 09/16/2022  PCP: Pcp, No  Patient coming from: Home  Chief Complaint: Chest pain  HPI: Edward Schroeder is a 42 y.o. male with medical history significant of CAD, hyperlipidemia, sinus bradycardia, marijuana abuse, tobacco abuse.  Seen in the ED on 09/15/2022 for low back pain which was felt to be musculoskeletal in nature.  He was given a dose of IM Toradol and discharged with naproxen and tramadol.  He came back to the ED yesterday after he broke out in diffuse hives after eating a sub at a restaurant in the setting of shellfish and pork allergy.  He had normal vital signs and no signs of anaphylaxis.  He was treated with Benadryl, Pepcid, and Solu-Medrol.  Hives had resolved and he remained stable and was discharged with a short course of prednisone, scheduled antihistamine, and EpiPen.  Patient later had a video visit with his PCP and was prescribed baclofen and Medrol Dosepak for his acute bilateral low back pain without sciatica.  Patient returns to the ED tonight complaining of chest pain.  He took aspirin at home and chest pain resolved by the time EMS arrived.  Vital signs stable on arrival to the ED.  Labs showing WBC 15.3, UDS pending, troponin 7> 23.  EKG without acute ischemic changes.  Chest x-ray showing no active cardiopulmonary disease. ED PA discussed the case with Dr. Jayme Cloud with cardiology who recommended hospitalist admission and echo in the morning.  Consult cardiology if echo is abnormal.  Patient states he was walking to his backyard tonight when all of a sudden he had severe 10 out of 10 intensity squeezing/sharp nonradiating left-sided chest pain.  He was sweating at that time but did not have any nausea/vomiting or shortness of breath.  He then took 4 tablets of aspirin 81 mg and chest pain resolved after 10 minutes prior to EMS arrival.  He did play basketball the day prior but denies any injury to his  chest or pulling a muscle.  He is right-handed.  He denies history of blood clots or leg pain/swelling.  Patient states he had cardiac catheterization in [place] in May 2023 and was told that he did not have any blockages.  Per friend at bedside, patient has complained of chest pain about 2 or 3 other times this past month and patient believes each time it was with exertion and resolved in a few minutes.  Patient is reporting 4 episodes of vomiting earlier yesterday but no longer nauseous or vomiting now.  He has been able to tolerate p.o. intake.  Denies abdominal pain or constipation.  Review of Systems:  Review of Systems  All other systems reviewed and are negative.   Past Medical History:  Diagnosis Date   Coronary artery disease     Past Surgical History:  Procedure Laterality Date   I & D EXTREMITY Left 08/20/2015   Procedure: IRRIGATION AND DEBRIDEMENT OF FIRST SECOND AND THIRD TOE FRACTURE;  Surgeon: Toni Arthurs, MD;  Location: [facility] OR;  Service: Orthopedics;  Laterality: Left;     reports that he has been smoking cigars and cigarettes. He has been smoking an average of 2 packs per day. He has never used smokeless tobacco. He reports current alcohol use. He reports current drug use. Drug: Marijuana.  Allergies  Allergen Reactions   Shellfish Allergy Hives   Pork-Derived Products Hives    Family History  Problem Relation Age of Onset  Healthy Mother    Healthy Father     Prior to Admission medications   Medication Sig Start Date End Date Taking? Authorizing Provider  aspirin 81 MG chewable tablet Chew 81 mg by mouth once. 02/03/22  Yes [provider]  naproxen (NAPROSYN) 500 MG tablet Take 1 tablet (500 mg total) by mouth 2 (two) times daily. 09/15/22  Yes Delo, Riley Lam, MD  traMADol (ULTRAM) 50 MG tablet Take 1 tablet (50 mg total) by mouth every 6 (six) hours as needed. 09/15/22  Yes Delo, Riley Lam, MD  baclofen (LIORESAL) 10 MG tablet Take 1 tablet (10 mg total) by  mouth 3 (three) times daily. Patient not taking: Reported on 09/17/2022 09/16/22   Jannifer Rodney A, FNP  EPINEPHrine 0.3 mg/0.3 mL IJ SOAJ injection Inject 0.3 mg into the muscle as needed for anaphylaxis. Patient not taking: Reported on 09/17/2022 09/16/22   Gilda Crease, MD  methylPREDNISolone (MEDROL DOSEPAK) 4 MG TBPK tablet As directed Patient not taking: Reported on 09/17/2022 09/16/22   Gilda Crease, MD    Physical Exam: Vitals:   09/16/22 2206 09/17/22 0130 09/17/22 0327  BP: 125/73 121/75 (!) 170/93  Pulse: 72 (!) 54 70  Resp: 18 17 15   Temp: 98.3 F (36.8 C)    TempSrc: Oral    SpO2: 99% 98% 97%  Weight: 89.8 kg    Height: 5\' 10"  (1.778 m)      Physical Exam Vitals reviewed.  Constitutional:      General: He is not in acute distress. HENT:     Head: Normocephalic and atraumatic.  Eyes:     Extraocular Movements: Extraocular movements intact.  Cardiovascular:     Rate and Rhythm: Normal rate and regular rhythm.     Pulses: Normal pulses.  Pulmonary:     Effort: Pulmonary effort is normal. No respiratory distress.     Breath sounds: Normal breath sounds. No wheezing or rales.  Abdominal:     General: Bowel sounds are normal. There is no distension.     Palpations: Abdomen is soft.     Tenderness: There is no abdominal tenderness. There is no guarding.  Musculoskeletal:     Cervical back: Normal range of motion.     Right lower leg: No edema.     Left lower leg: No edema.  Skin:    General: Skin is warm and dry.     Comments: No rash or urticaria  Neurological:     General: No focal deficit present.     Mental Status: He is alert and oriented to person, place, and time.     Labs on Admission: I have personally reviewed following labs and imaging studies  CBC: Recent Labs  Lab 09/16/22 2230  WBC 15.3*  HGB 15.2  HCT 44.5  MCV 88.8  PLT 273   Basic Metabolic Panel: Recent Labs  Lab 09/16/22 2230  NA 138  K 3.7  CL 104  CO2 23   GLUCOSE 112*  BUN 17  CREATININE 0.97  CALCIUM 9.1   GFR: Estimated Creatinine Clearance: 115.3 mL/min (by C-G formula based on SCr of 0.97 mg/dL). Liver Function Tests: No results for input(s): "AST", "ALT", "ALKPHOS", "BILITOT", "PROT", "ALBUMIN" in the last 168 hours. No results for input(s): "LIPASE", "AMYLASE" in the last 168 hours. No results for input(s): "AMMONIA" in the last 168 hours. Coagulation Profile: No results for input(s): "INR", "PROTIME" in the last 168 hours. Cardiac Enzymes: No results for input(s): "CKTOTAL", "CKMB", "CKMBINDEX", "TROPONINI"  in the last 168 hours. BNP (last 3 results) No results for input(s): "PROBNP" in the last 8760 hours. HbA1C: No results for input(s): "HGBA1C" in the last 72 hours. CBG: No results for input(s): "GLUCAP" in the last 168 hours. Lipid Profile: No results for input(s): "CHOL", "HDL", "LDLCALC", "TRIG", "CHOLHDL", "LDLDIRECT" in the last 72 hours. Thyroid Function Tests: No results for input(s): "TSH", "T4TOTAL", "FREET4", "T3FREE", "THYROIDAB" in the last 72 hours. Anemia Panel: No results for input(s): "VITAMINB12", "FOLATE", "FERRITIN", "TIBC", "IRON", "RETICCTPCT" in the last 72 hours. Urine analysis:    Component Value Date/Time   COLORURINE YELLOW 06/03/2016 1350   APPEARANCEUR CLEAR 06/03/2016 1350   LABSPEC 1.032 (H) 06/03/2016 1350   PHURINE 7.0 06/03/2016 1350   GLUCOSEU NEGATIVE 06/03/2016 1350   HGBUR NEGATIVE 06/03/2016 1350   BILIRUBINUR NEGATIVE 06/03/2016 1350   KETONESUR NEGATIVE 06/03/2016 1350   PROTEINUR NEGATIVE 06/03/2016 1350   NITRITE NEGATIVE 06/03/2016 1350   LEUKOCYTESUR NEGATIVE 06/03/2016 1350    Radiological Exams on Admission: DG Chest 2 View  Result Date: 09/16/2022 CLINICAL DATA:  Left-sided chest pain.  Heart attack last March. EXAM: CHEST - 2 VIEW COMPARISON:  03/18/2022. FINDINGS: The heart size and mediastinal contours are within normal limits. No consolidation, effusion, or  pneumothorax. No acute osseous abnormality. IMPRESSION: No active cardiopulmonary disease. Electronically Signed   By: Thornell Sartorius M.D.   On: 09/16/2022 22:43    EKG: Independently reviewed.  Sinus bradycardia, no acute ischemic changes.  Assessment and Plan  Chest pain Troponin 7> 23.  EKG without acute ischemic changes.  Patient is no longer having active chest pain.  Chest x-ray showing no active cardiopulmonary disease.  PE less likely given no tachycardia or hypoxia.  No clinical signs of DVT on exam.  Coronary CTA done in May 2023 showing minimal nonobstructive CAD.  ED PA discussed the case with Dr. Jayme Cloud with cardiology who recommended hospitalist admission and echo in the morning.  Consult cardiology if echo is abnormal.  Will continue cardiac monitoring and trend troponin.  UDS pending.  Nausea and vomiting Symptoms have resolved.  He is able to tolerate p.o. intake and denies abdominal pain or constipation.  Abdominal exam benign.  Checking lipase and LFTs.  Chronic sinus bradycardia Heart rate currently in the 60s but as low as 50s in the ED which appears to be his baseline based on review of prior EKGs.  Not hypotensive.  Continue cardiac monitoring.  TSH was normal in December 2023.  Patient is not on any AV nodal blocking agents.  Leukocytosis Likely related to recent steroid use.  No fever or infectious signs or symptoms.  Repeat CBC in a.m.  Tobacco abuse Smokes 2 packs of cigarettes daily.  NicoDerm patch and counsel to quit.  Acute bilateral low back pain without sciatica Continue baclofen, naproxen, tramadol as needed.  DVT prophylaxis: SCDs Code Status: Full Code (discussed with the patient) Family Communication: Male friend at bedside. Level of care: Progressive Care Unit Admission status: It is my clinical opinion that referral for OBSERVATION is reasonable and necessary in this patient based on the above information provided. The aforementioned taken  together are felt to place the patient at high risk for further clinical deterioration. However, it is anticipated that the patient may be medically stable for discharge from the hospital within 24 to 48 hours.   John Giovanni MD [facility]  If 7PM-7AM, please contact night-coverage [email or web address]  09/17/2022, 3:27 AM

## 2022-09-17 NOTE — ED Notes (Signed)
ED TO INPATIENT HANDOFF REPORT  ED Nurse Name and Phone #: Theadora Rama RN [phone]  S Name/Age/Gender Edward Schroeder 42 y.o. male Room/Bed: H021C/H021C  Code Status   Code Status: Prior  Home/SNF/Other Home Patient oriented to: self, place, time, and situation Is this baseline? Yes   Triage Complete: Triage complete  Chief Complaint Chest pain [R07.9]  Triage Note Pt arrived via [facility] from home with c/c of Chest Pain. Per EMS pt was walking around house when he had sudden onset of chest tightness described of fist with glass. Pt has hx of MI. Denies dizziness. EMS stated pt took ASA and when they arrived Chest Pain was gone. Pt endorses feels same as previous MI. Pt has back pain since Friday.  324 ASA CBG 118, 140/88, 76-64HR, 95% RA    Allergies Allergies  Allergen Reactions   Shellfish Allergy Hives   Pork-Derived Products Hives    Level of Care/Admitting Diagnosis ED Disposition     ED Disposition  Admit   Condition  --   Comment  Hospital Area: [facility] [[id number]]  Level of Care: Progressive [102]  Admit to Progressive based on following criteria: CARDIOVASCULAR & THORACIC of moderate stability with acute coronary syndrome symptoms/low risk myocardial infarction/hypertensive urgency/arrhythmias/heart failure potentially compromising stability and stable post cardiovascular intervention patients.  May place patient in observation at [facility] or [facility] if equivalent level of care is available:: Yes  Covid Evaluation: Asymptomatic - no recent exposure (last 10 days) testing not required  Diagnosis: Chest pain [119147]  Admitting Physician: John Giovanni [[id number]]  Attending Physician: John Giovanni [[id number]]          B Medical/Surgery History Past Medical History:  Diagnosis Date   Coronary artery disease    Past Surgical History:  Procedure Laterality Date   I & D EXTREMITY Left 08/20/2015   Procedure: IRRIGATION AND  DEBRIDEMENT OF FIRST SECOND AND THIRD TOE FRACTURE;  Surgeon: Toni Arthurs, MD;  Location: [facility] OR;  Service: Orthopedics;  Laterality: Left;     A IV Location/Drains/Wounds Patient Lines/Drains/Airways Status     Active Line/Drains/Airways     Name Placement date Placement time Site Days   Peripheral IV 09/16/22 20 G Anterior;Right Forearm 09/16/22  --  Forearm  1            Intake/Output Last 24 hours No intake or output data in the 24 hours ending 09/17/22 0219  Labs/Imaging Results for orders placed or performed during the hospital encounter of 09/16/22 (from the past 48 hour(s))  Basic metabolic panel     Status: Abnormal   Collection Time: 09/16/22 10:30 PM  Result Value Ref Range   Sodium 138 135 - 145 mmol/L   Potassium 3.7 3.5 - 5.1 mmol/L   Chloride 104 98 - 111 mmol/L   CO2 23 22 - 32 mmol/L   Glucose, Bld 112 (H) 70 - 99 mg/dL    Comment: Glucose reference range applies only to samples taken after fasting for at least 8 hours.   BUN 17 6 - 20 mg/dL   Creatinine, Ser 4.69 0.61 - 1.24 mg/dL   Calcium 9.1 8.9 - 62.9 mg/dL   GFR, Estimated >52 >84 mL/min    Comment: (NOTE) Calculated using the CKD-EPI Creatinine Equation (2021)    Anion gap 11 5 - 15    Comment: Performed at [facility], [address], [place], [place] [address]  CBC     Status: Abnormal  Collection Time: 09/16/22 10:30 PM  Result Value Ref Range   WBC 15.3 (H) 4.0 - 10.5 K/uL   RBC 5.01 4.22 - 5.81 MIL/uL   Hemoglobin 15.2 13.0 - 17.0 g/dL   HCT 91.4 78.2 - 95.6 %   MCV 88.8 80.0 - 100.0 fL   MCH 30.3 26.0 - 34.0 pg   MCHC 34.2 30.0 - 36.0 g/dL   RDW 21.3 08.6 - 57.8 %   Platelets 273 150 - 400 K/uL   nRBC 0.0 0.0 - 0.2 %    Comment: Performed at [facility], [address] [place], [place] [address]  Troponin I (High Sensitivity)     Status: None   Collection Time: 09/16/22 10:30 PM  Result Value Ref Range   Troponin I (High Sensitivity) 7 <18 ng/L    Comment:  (NOTE) Elevated high sensitivity troponin I (hsTnI) values and significant  changes across serial measurements may suggest ACS but many other  chronic and acute conditions are known to elevate hsTnI results.  Refer to the "Links" section for chest pain algorithms and additional  guidance. Performed at [facility], [address] [place], [place] [address]   Troponin I (High Sensitivity)     Status: Abnormal   Collection Time: 09/17/22 12:10 AM  Result Value Ref Range   Troponin I (High Sensitivity) 23 (H) <18 ng/L    Comment: (NOTE) Elevated high sensitivity troponin I (hsTnI) values and significant  changes across serial measurements may suggest ACS but many other  chronic and acute conditions are known to elevate hsTnI results.  Refer to the "Links" section for chest pain algorithms and additional  guidance. Performed at [facility], [address] [place], [place] [address]    DG Chest 2 View  Result Date: 09/16/2022 CLINICAL DATA:  Left-sided chest pain.  Heart attack last March. EXAM: CHEST - 2 VIEW COMPARISON:  03/18/2022. FINDINGS: The heart size and mediastinal contours are within normal limits. No consolidation, effusion, or pneumothorax. No acute osseous abnormality. IMPRESSION: No active cardiopulmonary disease. Electronically Signed   By: Thornell Sartorius M.D.   On: 09/16/2022 22:43    Pending Labs Unresulted Labs (From admission, onward)     Start     Ordered   09/17/22 0140  Hepatic function panel  Add-on,   AD        09/17/22 0140   09/16/22 2208  Rapid urine drug screen (hospital performed)  ONCE - STAT,   STAT        09/16/22 2207            Vitals/Pain Today's Vitals   09/16/22 2206 09/17/22 0130  BP: 125/73 121/75  Pulse: 72 (!) 54  Resp: 18 17  Temp: 98.3 F (36.8 C)   TempSrc: Oral   SpO2: 99% 98%  Weight: 89.8 kg   Height: 5\' 10"  (1.778 m)   PainSc: 0-No pain     Isolation Precautions No active  isolations  Medications Medications - No data to display  Mobility walks     Focused Assessments Cardiac Assessment Handoff:  Cardiac Rhythm: Normal sinus rhythm Lab Results  Component Value Date   TROPONINI <0.03 02/05/2018   No results found for: "DDIMER" Does the Patient currently have chest pain? No   , Neuro Assessment Handoff:  Swallow screen pass? Yes  Cardiac Rhythm: Normal sinus rhythm       Neuro Assessment: Within Defined Limits Neuro Checks:  Has TPA been given? No If patient is a Neuro Trauma and patient is going to OR before floor call report to [place] nurse: [phone] or [phone]   R Recommendations: See Admitting Provider Note  Report given to:   Additional Notes:

## 2022-09-18 DIAGNOSIS — M545 Low back pain, unspecified: Secondary | ICD-10-CM | POA: Diagnosis not present

## 2022-09-18 DIAGNOSIS — R079 Chest pain, unspecified: Secondary | ICD-10-CM | POA: Diagnosis not present

## 2022-10-16 ENCOUNTER — Encounter: Payer: Self-pay | Admitting: Cardiology

## 2022-10-16 ENCOUNTER — Ambulatory Visit: Payer: Self-pay | Admitting: Cardiology

## 2022-10-16 VITALS — BP 120/78 | HR 69 | Resp 16 | Ht 70.0 in | Wt 199.4 lb

## 2022-10-16 DIAGNOSIS — R0789 Other chest pain: Secondary | ICD-10-CM

## 2022-10-16 DIAGNOSIS — Z720 Tobacco use: Secondary | ICD-10-CM

## 2022-10-16 DIAGNOSIS — E7800 Pure hypercholesterolemia, unspecified: Secondary | ICD-10-CM

## 2022-10-16 NOTE — Progress Notes (Signed)
Primary Physician/Referring:  [facility]  Patient ID: Edward Schroeder, male    DOB: 10/16/1980, 42 y.o.   MRN: [id number]  Chief Complaint  Patient presents with   Chest Pain   New Patient (Initial Visit)    Referred by Ruthy Dick, FNP   HPI:    Edward Schroeder  is a 42 y.o. African-American male patient with hyperlipidemia, hyperglycemia, tobacco use disorder referred to me for evaluation of chest pain.  Patient has had multiple ED visits, also in 2021 while he was visiting [place] area, was admitted to the hospital with what appears to be chest pain and his troponins were minimally elevated and underwent cardiac catheterization revealing no coronary artery disease.  He has also had coronary CTA 6 months ago, which revealed no significant disease with a coronary calcium score of 0 but a minimal disease in the ostium of the right coronary artery which may be an artifact as well.  Patient states that he has had chest pain for many months now, describes it as sharp pain across the left side of the chest sometimes feels tingling and numbness in his hands.  States that if he chews 4 aspirins he starts feeling better.  Taking deep breath worsens chest pain.  No hemoptysis, no leg edema.  There is no family history of premature coronary disease.  He has not had any chest pain over the past 3 weeks or more.  Past Medical History:  Diagnosis Date   Hyperlipidemia    Past Surgical History:  Procedure Laterality Date   I & D EXTREMITY Left 08/20/2015   Procedure: IRRIGATION AND DEBRIDEMENT OF FIRST SECOND AND THIRD TOE FRACTURE;  Surgeon: Toni Arthurs, MD;  Location: [facility] OR;  Service: Orthopedics;  Laterality: Left;   Family History  Problem Relation Age of Onset   Healthy Mother    Healthy Father     Social History   Tobacco Use   Smoking status: Every Day    Packs/day: 2    Types: Cigars, Cigarettes   Smokeless tobacco: Never  Substance Use Topics   Alcohol  use: Yes    Comment: occ   Marital Status: Single  ROS  Review of Systems  Cardiovascular:  Negative for chest pain, dyspnea on exertion and leg swelling.   Objective      10/16/2022    3:33 PM 09/17/2022    8:29 AM 09/17/2022    3:45 AM  Vitals with BMI  Height 5\' 10"     Weight 199 lbs 6 oz    BMI 28.61    Systolic 120 124 782  Diastolic 78 81 74  Pulse 69 61    SpO2: 97 %   Physical Exam Neck:     Vascular: No carotid bruit or JVD.  Cardiovascular:     Rate and Rhythm: Normal rate and regular rhythm.     Pulses: Intact distal pulses.     Heart sounds: Normal heart sounds. No murmur heard.    No gallop.  Pulmonary:     Effort: Pulmonary effort is normal.     Breath sounds: Normal breath sounds.  Abdominal:     General: Bowel sounds are normal.     Palpations: Abdomen is soft.  Musculoskeletal:     Right lower leg: No edema.     Left lower leg: No edema.     Laboratory examination:   Recent Labs    03/18/22 0011 05/18/22 0838 09/16/22 2230  NA 143 135 138  K 3.8 3.9 3.7  CL 104 103 104  CO2  --  22 23  GLUCOSE 97 106* 112*  BUN 7 11 17   CREATININE 0.80 0.79 0.97  CALCIUM  --  9.7 9.1  GFRNONAA  --  >60 >60    Lab Results  Component Value Date   GLUCOSE 112 (H) 09/16/2022   NA 138 09/16/2022   K 3.7 09/16/2022   CL 104 09/16/2022   CO2 23 09/16/2022   BUN 17 09/16/2022   CREATININE 0.97 09/16/2022   GFRNONAA >60 09/16/2022   CALCIUM 9.1 09/16/2022   PROT 6.0 (L) 09/17/2022   ALBUMIN 3.4 (L) 09/17/2022   BILITOT 0.5 09/17/2022   ALKPHOS 75 09/17/2022   AST 29 09/17/2022   ALT 26 09/17/2022   ANIONGAP 11 09/16/2022      Lab Results  Component Value Date   ALT 26 09/17/2022   AST 29 09/17/2022   ALKPHOS 75 09/17/2022   BILITOT 0.5 09/17/2022       Latest Ref Rng & Units 09/17/2022    5:41 AM 05/18/2022    8:38 AM 01/18/2017    3:35 PM  Hepatic Function  Total Protein 6.5 - 8.1 g/dL 6.0  7.5  6.7   Albumin 3.5 - 5.0 g/dL 3.4  4.6   3.9   AST 15 - 41 U/L 29  32  29   ALT 0 - 44 U/L 26  30  21    Alk Phosphatase 38 - 126 U/L 75  67  71   Total Bilirubin 0.3 - 1.2 mg/dL 0.5  0.3  0.5   Bilirubin, Direct 0.0 - 0.2 mg/dL 0.1       Lipid Panel Recent Labs    03/18/22 0600 05/18/22 0838  CHOL 91 263*  TRIG 46 286*  LDLCALC 52 158*  VLDL 9 57*  HDL 30* 48  CHOLHDL 3.0 5.5    HEMOGLOBIN Y3K Lab Results  Component Value Date   HGBA1C 5.7 (H) 05/18/2022   MPG 117 05/18/2022   TSH Recent Labs    05/18/22 0838  TSH 0.682    Radiology:    Cardiac Studies:   Coronary CT angiogram 10/17/21: 1.  Coronary calcium score of 0.  2.  Normal coronary origin with right dominance.  3. Nonobstructive CAD with noncalcified plaque causing minimal (0-24%) stenosis in ostial RCA   CAD-RADS 1. Minimal non-obstructive CAD (0-24%). Consider non-atherosclerotic causes of chest pain. Consider preventive therapy and risk factor modification  Echocardiogram 09/17/2022: 1. Left ventricular ejection fraction, by estimation, is 55 to 60%. The left ventricle has normal function. The left ventricle has no regional wall motion abnormalities. Left ventricular diastolic parameters were normal.  2. Right ventricular systolic function is normal. The right ventricular size is normal.  3. The mitral valve is normal in structure. No evidence of mitral valve regurgitation. No evidence of mitral stenosis.  4. The aortic valve is tricuspid. Aortic valve regurgitation is not visualized. No aortic stenosis is present.  5. The inferior vena cava is dilated in size with >50% respiratory variability, suggesting right atrial pressure of 8 mmHg.  EKG:   EKG 10/16/2022: Normal sinus rhythm with rate of 66 bpm, normal EKG.   Medications and allergies   Allergies  Allergen Reactions   Shellfish Allergy Hives   Pork-Derived Products Hives     Medication list   Current Outpatient Medications:    aspirin 81 MG chewable tablet, Chew 81 mg by  mouth once., Disp: , Rfl:  naproxen (NAPROSYN) 500 MG tablet, Take 1 tablet (500 mg total) by mouth 2 (two) times daily., Disp: 20 tablet, Rfl: 0   Assessment     ICD-10-CM   1. Non-cardiac chest pain  R07.89 EKG 12-Lead    2. Hypercholesteremia  E78.00     3. Tobacco use  Z72.0        Orders Placed This Encounter  Procedures   EKG 12-Lead   No orders of the defined types were placed in this encounter.  Medications Discontinued During This Encounter  Medication Reason   traMADol (ULTRAM) 50 MG tablet      Recommendations:   Edward Schroeder is a 42 y.o. African-American male patient with hypercholesterolemia, hyperglycemia, tobacco use disorder referred to me for evaluation of chest pain.  He has had multiple ED visits with chest pain.  He has not had coronary artery disease by prior review and also he has not had any NSTEMI.  1. Non-cardiac chest pain His chest pain symptoms are clearly musculoskeletal with sharp pain and worse on taking deep breath with normal EKG and negative cardiac markers.  Minimal elevation in troponin is nonspecific.  I also reviewed his chart, he has no known coronary disease.  Minimal plaque in the right coronary artery is noted and his coronary calcium score is 0.  He does not have coronary artery disease.  I have reviewed the data with the patient and his girlfriend at the bedside and reassured them.  - EKG 12-Lead  2. Hypercholesteremia Patient has discontinued taking Lipitor, his lipids in September were under excellent control however in December 2023, lipids were markedly elevated.  He needs to be back on statins, he has now established with [employer].  As his lipids were well-controlled previously, he will take the previous dose of the Lipitor to his new patient visit appointment in the next 3 days.  3. Tobacco use I have discussed with the patient that in view of his age approaching 5, African-American heritage, hyperglycemia,  hypercholesterolemia, making lifestyle changes with regard to the diet and also smoking cessation extensively discussed.  As he has had complete workup from cardiac standpoint, I will see him back on a as needed basis.    Yates Decamp, MD, [facility] 10/16/2022, 6:17 PM Office: [phone]

## 2022-12-16 ENCOUNTER — Encounter (HOSPITAL_BASED_OUTPATIENT_CLINIC_OR_DEPARTMENT_OTHER): Payer: Self-pay | Admitting: Emergency Medicine

## 2022-12-16 ENCOUNTER — Emergency Department (HOSPITAL_BASED_OUTPATIENT_CLINIC_OR_DEPARTMENT_OTHER)
Admission: EM | Admit: 2022-12-16 | Discharge: 2022-12-16 | Disposition: A | Discharge status: Home / Self Care | Payer: 59 | Attending: Emergency Medicine | Admitting: Emergency Medicine

## 2022-12-16 DIAGNOSIS — L02416 Cutaneous abscess of left lower limb: Secondary | ICD-10-CM | POA: Diagnosis not present

## 2022-12-16 DIAGNOSIS — Z7982 Long term (current) use of aspirin: Secondary | ICD-10-CM | POA: Diagnosis not present

## 2022-12-16 DIAGNOSIS — M7989 Other specified soft tissue disorders: Secondary | ICD-10-CM | POA: Diagnosis not present

## 2022-12-16 DIAGNOSIS — L02415 Cutaneous abscess of right lower limb: Secondary | ICD-10-CM | POA: Diagnosis not present

## 2022-12-16 MED ORDER — CEPHALEXIN 500 MG PO CAPS: 500.0000 mg | ORAL_CAPSULE | Freq: Three times a day (TID) | ORAL | 0 refills | Status: AC

## 2022-12-16 NOTE — ED Notes (Signed)
ED Provider at bedside. 

## 2022-12-16 NOTE — Discharge Instructions (Signed)
You were seen in the emergency department for an abscess of your leg.  The ultrasound did not show any drainable fluid collection.  Please take antibiotics.  Continue warm wet compresses 3-4 times. Avoid skin chaffing.

## 2022-12-16 NOTE — ED Provider Notes (Signed)
[place] [facility] Provider Note   CSN: [id number] Arrival date & time: 12/16/22  6440     History  Chief Complaint  Patient presents with   Abscess    Edward Schroeder is a 42 y.o. male.  He has a complaint of abscess to his upper right thigh that began yesterday.  He has tried nothing for it no fever or other systemic symptoms.  No prior history of same.  The history is provided by the patient.  Abscess Location:  Leg Leg abscess location:  R upper leg Abscess quality: draining (2), induration, painful and redness   Abscess quality: no fluctuance   Context: not diabetes and not immunosuppression   Relieved by:  None tried Worsened by:  Nothing Ineffective treatments:  None tried Associated symptoms: no fever, no nausea and no vomiting        Home Medications Prior to Admission medications   Medication Sig Start Date End Date Taking? Authorizing Provider  aspirin 81 MG chewable tablet Chew 81 mg by mouth once. 02/03/22   [provider]  naproxen (NAPROSYN) 500 MG tablet Take 1 tablet (500 mg total) by mouth 2 (two) times daily. 09/15/22   Geoffery Lyons, MD      Allergies    Shellfish allergy, Pork-derived products, and Tramadol    Review of Systems   Review of Systems  Constitutional:  Negative for fever.  Gastrointestinal:  Negative for nausea and vomiting.    Physical Exam Updated Vital Signs BP 114/72   Pulse (!) 55   Temp 97.6 F (36.4 C) (Oral)   Resp 16   Ht 5\' 10"  (1.778 m)   Wt 88.5 kg   SpO2 95%   BMI 27.98 kg/m  Physical Exam Vitals and nursing note reviewed.  Constitutional:      Appearance: He is well-developed.  HENT:     Head: Normocephalic and atraumatic.  Eyes:     Conjunctiva/sclera: Conjunctivae normal.  Pulmonary:     Effort: Pulmonary effort is normal.  Musculoskeletal:        General: Tenderness present. No deformity. Normal range of motion.     Cervical back: Neck supple.      Comments: Remains in her upper thigh that is 2 cm.  Induration with a few small pustules.  Slightly red.  No streaking.  No pus expressed.  Skin:    General: Skin is warm and dry.  Neurological:     General: No focal deficit present.     Mental Status: He is alert.     GCS: GCS eye subscore is 4. GCS verbal subscore is 5. GCS motor subscore is 6.     ED Results / Procedures / Treatments   Labs (all labs ordered are listed, but only abnormal results are displayed) Labs Reviewed - No data to display  EKG None  Radiology No results found.  Procedures Ultrasound ED Soft Tissue  Date/Time: 12/16/2022 5:51 PM  Performed by: Terrilee Files, MD Authorized by: Terrilee Files, MD   Procedure details:    Indications: localization of abscess     Transverse view:  Visualized   Longitudinal view:  Visualized   Images: archived   Location:    Location: lower extremity   Findings:     no abscess present    cellulitis present    no foreign body present Comments:     No evidence of fluid collection that would benefit from I&D at this time  Medications Ordered in ED Medications - No data to display  ED Course/ Medical Decision Making/ A&P                             Medical Decision Making Risk Prescription drug management.   This patient complains of abscess right upper thigh; this involves an extensive number of treatment Options and is a complaint that carries with it a high risk of complications and morbidity. The differential includes abscess, cellulitis, induration, irritation Additional history obtained from patient's significant other Previous records obtained and reviewed in epic no recent admissions Social determinants considered, patient with ongoing tobacco use Critical Interventions: None  After the interventions stated above, I reevaluated the patient and found patient to be nontoxic-appearing no distress Admission and further testing considered, no  indications for admission or further workup at this time.  Based on his ultrasound no indication for I&D.  Will cover with antibiotics and protect area from skin trauma, patient will continue warm compresses.  Return instructions discussed         Final Clinical Impression(s) / ED Diagnoses Final diagnoses:  Abscess of left leg    Rx / DC Orders ED Discharge Orders          Ordered    cephALEXin (KEFLEX) 500 MG capsule  3 times daily        12/16/22 0947              Terrilee Files, MD 12/16/22 1753

## 2022-12-16 NOTE — ED Triage Notes (Signed)
Pt reports he had a boil on his upper R thigh that burst yesterday.

## 2023-01-10 ENCOUNTER — Emergency Department (HOSPITAL_COMMUNITY)
Admission: EM | Admit: 2023-01-10 | Discharge: 2023-01-11 | Disposition: A | Discharge status: Home / Self Care | Payer: 59 | Attending: Emergency Medicine | Admitting: Emergency Medicine

## 2023-01-10 DIAGNOSIS — I2510 Atherosclerotic heart disease of native coronary artery without angina pectoris: Secondary | ICD-10-CM | POA: Insufficient documentation

## 2023-01-10 DIAGNOSIS — T782XXA Anaphylactic shock, unspecified, initial encounter: Secondary | ICD-10-CM | POA: Diagnosis not present

## 2023-01-10 DIAGNOSIS — G4489 Other headache syndrome: Secondary | ICD-10-CM | POA: Diagnosis not present

## 2023-01-10 DIAGNOSIS — Z7982 Long term (current) use of aspirin: Secondary | ICD-10-CM | POA: Insufficient documentation

## 2023-01-10 DIAGNOSIS — T7840XA Allergy, unspecified, initial encounter: Secondary | ICD-10-CM | POA: Diagnosis not present

## 2023-01-10 DIAGNOSIS — L509 Urticaria, unspecified: Secondary | ICD-10-CM | POA: Diagnosis not present

## 2023-01-10 DIAGNOSIS — R42 Dizziness and giddiness: Secondary | ICD-10-CM | POA: Diagnosis not present

## 2023-01-10 NOTE — ED Triage Notes (Incomplete)
Per ems, pt from home with an allergic reaction that started about 10pm. Pt reports eating  frozen beef burritos. Pt started getting dizzy/lightheaded and took a Zyrtec. Pt was unable to speak in full sentences upon ems arrival reporting chest pain. EMS BP 87/54. Pt given 1 epi in thigh. Pt's mental status and BP improved post epi to 100/68. Then proceeded to get itchy/hives on back. Ems gave solumedrol and benadryl IV. Hives have improved. Placed on 2L [place], lungs clear and equal bilaterally. Possible cross contamination. Allergy to shrimp. 20g lac

## 2023-01-10 NOTE — ED Triage Notes (Signed)
Per ems, pt from home with an allergic reaction that started about 10pm. Pt reports eating frozen beef burritos. Pt started getting dizzy/lightheaded and took a Zyrtec thinking it was allergies. Pt was unable to speak in full sentences upon ems arrival reporting chest pain. EMS BP 87/54. Pt given 1 epi in thigh. Pt's mental status and BP improved post epi to 100/68. Then proceeded to get itchy/hives on back. Ems gave solumedrol and benadryl IV. Hives have improved. Placed on 2L [place] by ems, lungs clear and equal bilaterally. Possible cross contamination. Hx of allergy to shrimp. Pt reports headache upon arrival. Denies any shob/throat closing at this time. Pt 97% on room air in triage.

## 2023-01-11 ENCOUNTER — Other Ambulatory Visit: Payer: Self-pay

## 2023-01-11 ENCOUNTER — Encounter (HOSPITAL_COMMUNITY): Payer: Self-pay | Admitting: Emergency Medicine

## 2023-01-11 NOTE — Discharge Instructions (Addendum)
You were evaluated today for an allergic reaction.  This may have been a mild anaphylactic reaction.  Please use your EpiPen at home if you experience an anaphylactic reaction and call 911 or go to the nearest emergency department.  Follow-up as needed for routine care with your primary care provider.

## 2023-01-11 NOTE — ED Provider Notes (Signed)
[place] [facility] AT [facility] Provider Note   CSN: [id number] Arrival date & time: 01/10/23  2354     History  Chief Complaint  Patient presents with   Allergic Reaction    Edward Schroeder is a 42 y.o. male.  Patient presents to the ER via EMS due to an apparent anaphylactic reaction.  Patient stated that around 10 PM he was eating a frozen beef [place] and began to get dizzy/lightheaded.  He thought it may be allergies and took a Zyrtec.  Upon EMS arrival the patient was unable to speak in full sentences and complained of shortness of breath/chest pain.  EMS reported initial blood pressure of 87/54.  He was given 1 dose of epinephrine in the thigh and his mental status improved rapidly and BP improved to 100/68.  He then developed some hives and itchiness on his back.  He was administered Solu-Medrol and Benadryl.  Upon arrival at the emergency department hives had improved and the patient was no longer having any difficulty with breathing.  He denies shortness of breath/difficulty swallowing/difficulty taking deep breaths.  He did complain of a mild headache upon arrival.  Patient with a room air saturation of 97%.  Past medical history significant for headaches, coronary artery disease, hyperlipidemia, allergies to shellfish and pork  HPI     Home Medications Prior to Admission medications   Medication Sig Start Date End Date Taking? Authorizing Provider  aspirin 81 MG chewable tablet Chew 81 mg by mouth once. 02/03/22   [provider]  cephALEXin (KEFLEX) 500 MG capsule Take 1 capsule (500 mg total) by mouth 3 (three) times daily. 12/16/22   Terrilee Files, MD  naproxen (NAPROSYN) 500 MG tablet Take 1 tablet (500 mg total) by mouth 2 (two) times daily. 09/15/22   Geoffery Lyons, MD      Allergies    Shellfish allergy, Pork-derived products, and Tramadol    Review of Systems   Review of Systems  Physical Exam Updated Vital Signs BP 121/89 (BP  Location: Right Arm)   Pulse 87   Temp 98 F (36.7 C)   Resp 18   Ht 5\' 9"  (1.753 m)   Wt 90.7 kg   SpO2 94%   BMI 29.53 kg/m  Physical Exam Vitals and nursing note reviewed.  Constitutional:      General: He is not in acute distress.    Appearance: He is well-developed.  HENT:     Head: Normocephalic and atraumatic.  Eyes:     Conjunctiva/sclera: Conjunctivae normal.  Cardiovascular:     Rate and Rhythm: Normal rate and regular rhythm.  Pulmonary:     Effort: Pulmonary effort is normal. No respiratory distress.     Breath sounds: Normal breath sounds.  Abdominal:     Palpations: Abdomen is soft.     Tenderness: There is no abdominal tenderness.  Musculoskeletal:        General: No swelling.     Cervical back: Neck supple.  Skin:    General: Skin is warm and dry.     Capillary Refill: Capillary refill takes less than 2 seconds.     Findings: Rash (Small scattered hives on patient's back, improved since arrival) present.  Neurological:     Mental Status: He is alert.  Psychiatric:        Mood and Affect: Mood normal.     ED Results / Procedures / Treatments   Labs (all labs ordered are listed, but only  abnormal results are displayed) Labs Reviewed - No data to display  EKG None  Radiology No results found.  Procedures Procedures    Medications Ordered in ED Medications - No data to display  ED Course/ Medical Decision Making/ A&P                                 Medical Decision Making  This patient presents to the ED for concern of allergic reaction, this involves an extensive number of treatment options, and is a complaint that carries with it a high risk of complications and morbidity.  The differential diagnosis includes allergic reaction, anaphylactic reaction, others   Co morbidities that complicate the patient evaluation  History of shellfish and pork allergies   Additional history obtained:  Additional history obtained from  EMS External records from outside source obtained and reviewed including Cardiology notes   Test / Admission - Considered:  Patient's with symptoms consistent with an anaphylactic reaction based on history, physical, and rapid improvement with epinephrine administration.  Unclear if there may have been cross-contamination with the patient's food.  He does have an EpiPen prescription and states he has an EpiPen at home but he did not realize he was having that type of reaction at the time.  He has been monitored at the emergency department for over 3 hours now.  He has remained stable.  No episodes of shortness of breath, hives improved, oxygen saturations have been high on room air.  Plan to discharge at this time with return precautions.         Final Clinical Impression(s) / ED Diagnoses Final diagnoses:  Anaphylaxis, initial encounter    Rx / DC Orders ED Discharge Orders     None         Pamala Duffel 01/11/23 0305    Tilden Fossa, MD 01/11/23 [phone]

## 2023-01-23 ENCOUNTER — Telehealth: Payer: Self-pay

## 2023-01-23 NOTE — Telephone Encounter (Signed)
Transition Care Management Unsuccessful Follow-up Telephone Call  Date of discharge and from where:  [facility]ro[facility] 8/23  Attempts:  1st Attempt  Reason for unsuccessful TCM follow-up call:  No answer/busy   Lenard Forth [place]  [facility], [facility] Guide, Phone: [phone] Website: [email or web address]

## 2023-01-24 ENCOUNTER — Telehealth: Payer: Self-pay

## 2023-01-24 NOTE — Telephone Encounter (Signed)
Transition Care Management Unsuccessful Follow-up Telephone Call  Date of discharge and from where:  [facility]  Attempts:  2nd Attempt  Reason for unsuccessful TCM follow-up call:  No answer/busy   Lenard Forth [place]  [facility], [employer] Guide, Phone: [phone] Website: [email or web address]

## 2023-05-21 DIAGNOSIS — Z1389 Encounter for screening for other disorder: Secondary | ICD-10-CM | POA: Diagnosis not present

## 2023-05-21 DIAGNOSIS — Z131 Encounter for screening for diabetes mellitus: Secondary | ICD-10-CM | POA: Diagnosis not present

## 2023-05-21 DIAGNOSIS — Z13220 Encounter for screening for lipoid disorders: Secondary | ICD-10-CM | POA: Diagnosis not present

## 2023-05-21 DIAGNOSIS — Z0000 Encounter for general adult medical examination without abnormal findings: Secondary | ICD-10-CM | POA: Diagnosis not present

## 2023-06-03 DIAGNOSIS — R079 Chest pain, unspecified: Secondary | ICD-10-CM | POA: Diagnosis not present

## 2023-06-17 ENCOUNTER — Encounter: Payer: Self-pay | Admitting: Cardiology

## 2023-07-13 ENCOUNTER — Emergency Department (HOSPITAL_BASED_OUTPATIENT_CLINIC_OR_DEPARTMENT_OTHER)
Admission: EM | Admit: 2023-07-13 | Discharge: 2023-07-13 | Disposition: A | Discharge status: Home / Self Care | Payer: Medicaid Other | Attending: Emergency Medicine | Admitting: Emergency Medicine

## 2023-07-13 ENCOUNTER — Other Ambulatory Visit: Payer: Self-pay

## 2023-07-13 ENCOUNTER — Encounter (HOSPITAL_BASED_OUTPATIENT_CLINIC_OR_DEPARTMENT_OTHER): Payer: Self-pay | Admitting: Emergency Medicine

## 2023-07-13 ENCOUNTER — Emergency Department (HOSPITAL_BASED_OUTPATIENT_CLINIC_OR_DEPARTMENT_OTHER): Payer: Medicaid Other

## 2023-07-13 DIAGNOSIS — M79604 Pain in right leg: Secondary | ICD-10-CM

## 2023-07-13 DIAGNOSIS — X58XXXA Exposure to other specified factors, initial encounter: Secondary | ICD-10-CM | POA: Diagnosis not present

## 2023-07-13 DIAGNOSIS — Z720 Tobacco use: Secondary | ICD-10-CM | POA: Diagnosis not present

## 2023-07-13 DIAGNOSIS — I2510 Atherosclerotic heart disease of native coronary artery without angina pectoris: Secondary | ICD-10-CM | POA: Diagnosis not present

## 2023-07-13 DIAGNOSIS — S76311A Strain of muscle, fascia and tendon of the posterior muscle group at thigh level, right thigh, initial encounter: Secondary | ICD-10-CM | POA: Diagnosis not present

## 2023-07-13 DIAGNOSIS — Z7982 Long term (current) use of aspirin: Secondary | ICD-10-CM | POA: Insufficient documentation

## 2023-07-13 MED ORDER — CYCLOBENZAPRINE HCL 10 MG PO TABS: 10.0000 mg | ORAL_TABLET | Freq: Two times a day (BID) | ORAL | 0 refills | Status: DC | PRN

## 2023-07-13 MED ORDER — IBUPROFEN 800 MG PO TABS
800.0000 mg | ORAL_TABLET | Freq: Once | ORAL | Status: AC
  Administered 2023-07-13: 800 mg via ORAL
  Filled 2023-07-13: qty 1

## 2023-07-13 MED ORDER — CYCLOBENZAPRINE HCL 10 MG PO TABS
10.0000 mg | ORAL_TABLET | Freq: Once | ORAL | Status: AC
  Administered 2023-07-13: 10 mg via ORAL
  Filled 2023-07-13: qty 1

## 2023-07-13 NOTE — ED Notes (Signed)
 Patient transported to Ultrasound

## 2023-07-13 NOTE — ED Provider Notes (Signed)
 [place] [facility] Provider Note   CSN: [id number] Arrival date & time: 07/13/23  1226     History {Add pertinent medical, surgical, social history, OB history to HPI:1} Chief Complaint  Patient presents with   Leg Pain    Edward Schroeder is a 43 y.o. male.  HPI     43yo male with history of coronary artery disease, hyperlipidemia, sinus bradycardia, marijuana use, tobacco abuse who presents with concern for right leg pain.  Began this morning right hamstring. Referee work made it feel worse.  Past Medical History:  Diagnosis Date   Hyperlipidemia      Home Medications Prior to Admission medications   Medication Sig Start Date End Date Taking? Authorizing Provider  aspirin 81 MG chewable tablet Chew 81 mg by mouth once. 02/03/22   [provider]  cephALEXin (KEFLEX) 500 MG capsule Take 1 capsule (500 mg total) by mouth 3 (three) times daily. 12/16/22   Terrilee Files, MD  naproxen (NAPROSYN) 500 MG tablet Take 1 tablet (500 mg total) by mouth 2 (two) times daily. 09/15/22   Geoffery Lyons, MD      Allergies    Shellfish allergy, Pork-derived products, and Tramadol    Review of Systems   Review of Systems  Physical Exam Updated Vital Signs BP 112/68   Pulse (!) 56   Temp 98 F (36.7 C)   Resp 18   Ht 5\' 9"  (1.753 m)   Wt 89.8 kg   SpO2 96%   BMI 29.24 kg/m  Physical Exam  ED Results / Procedures / Treatments   Labs (all labs ordered are listed, but only abnormal results are displayed) Labs Reviewed - No data to display  EKG None  Radiology US Venous Img Lower Unilateral Right Result Date: 07/13/2023 CLINICAL DATA:  Acute onset posterior right thigh pain EXAM: RIGHT LOWER EXTREMITY VENOUS DOPPLER ULTRASOUND TECHNIQUE: Gray-scale sonography with compression, as well as color and duplex ultrasound, were performed to evaluate the deep venous system(s) from the level of the common femoral vein through the  popliteal and proximal calf veins. COMPARISON:  None Available. FINDINGS: VENOUS Normal compressibility of the common femoral, superficial femoral, and popliteal veins, as well as the visualized calf veins. Visualized portions of profunda femoral vein and great saphenous vein unremarkable. No filling defects to suggest DVT on grayscale or color Doppler imaging. Doppler waveforms show normal direction of venous flow, normal respiratory plasticity and response to augmentation. Limited views of the contralateral common femoral vein are unremarkable. OTHER None. Limitations: none IMPRESSION: Negative. Electronically Signed   By: Agustin Cree M.D.   On: 07/13/2023 13:42    Procedures Procedures  {Document cardiac monitor, telemetry assessment procedure when appropriate:1}  Medications Ordered in ED Medications  ibuprofen (ADVIL) tablet 800 mg (800 mg Oral Given 07/13/23 1248)    ED Course/ Medical Decision Making/ A&P   {   Click here for ABCD2, HEART and other calculatorsREFRESH Note before signing :1}                              Medical Decision Making Risk Prescription drug management.   ***  {Document critical care time when appropriate:1} {Document review of labs and clinical decision tools ie heart score, Chads2Vasc2 etc:1}  {Document your independent review of radiology images, and any outside records:1} {Document your discussion with family members, caretakers, and with consultants:1} {Document social determinants of health  affecting pt's care:1} {Document your decision making why or why not admission, treatments were needed:1} Final Clinical Impression(s) / ED Diagnoses Final diagnoses:  None    Rx / DC Orders ED Discharge Orders     None

## 2023-07-13 NOTE — ED Triage Notes (Signed)
 Right leg pain since last night.  No known injury.  No known fever.

## 2023-09-13 ENCOUNTER — Encounter: Payer: Self-pay | Admitting: Urgent Care

## 2023-09-13 ENCOUNTER — Ambulatory Visit
Admission: EM | Admit: 2023-09-13 | Discharge: 2023-09-13 | Disposition: A | Discharge status: Home / Self Care | Attending: Family Medicine | Admitting: Family Medicine

## 2023-09-13 ENCOUNTER — Ambulatory Visit (INDEPENDENT_AMBULATORY_CARE_PROVIDER_SITE_OTHER)

## 2023-09-13 DIAGNOSIS — R319 Hematuria, unspecified: Secondary | ICD-10-CM

## 2023-09-13 LAB — POCT URINALYSIS DIP (MANUAL ENTRY)
Bilirubin, UA: NEGATIVE
Glucose, UA: NEGATIVE mg/dL
Ketones, POC UA: NEGATIVE mg/dL
Nitrite, UA: NEGATIVE
Protein Ur, POC: NEGATIVE mg/dL
Spec Grav, UA: 1.015 (ref 1.010–1.025)
Urobilinogen, UA: 0.2 U/dL
pH, UA: 7 (ref 5.0–8.0)

## 2023-09-13 MED ORDER — TAMSULOSIN HCL 0.4 MG PO CAPS: 0.4000 mg | ORAL_CAPSULE | Freq: Every day | ORAL | 0 refills | Status: AC

## 2023-09-13 NOTE — Discharge Instructions (Signed)
 Hydrate very well with at least 80-100 ounces daily. Start tamsulosin for suspected kidney stone.  If you develop significant pain, fever, vomiting, stop being able to urinate then please go to the emergency room immediately. We will notify you of your urine culture results on Monday.

## 2023-09-13 NOTE — ED Provider Notes (Signed)
 [facility]  Note:  This document was prepared using Conservation officer, historic buildings and may include unintentional dictation errors.  MRN: [id number] DOB: 1981-01-01  Subjective:   Edward Schroeder is a 43 y.o. male presenting for 1 day history of hematuria.  Denies dysuria, hematuria, urinary frequency, penile discharge, penile swelling, testicular pain, testicular swelling, anal pain, groin pain.  No concern for STI.  No history of renal stone.  No history of kidney disease.  No current facility-administered medications for this encounter.  Current Outpatient Medications:    aspirin  81 MG chewable tablet, Chew 81 mg by mouth once., Disp: , Rfl:    cephALEXin  (KEFLEX ) 500 MG capsule, Take 1 capsule (500 mg total) by mouth 3 (three) times daily., Disp: 21 capsule, Rfl: 0   cyclobenzaprine  (FLEXERIL ) 10 MG tablet, Take 1 tablet (10 mg total) by mouth 2 (two) times daily as needed for muscle spasms., Disp: 20 tablet, Rfl: 0   naproxen  (NAPROSYN ) 500 MG tablet, Take 1 tablet (500 mg total) by mouth 2 (two) times daily., Disp: 20 tablet, Rfl: 0   Allergies  Allergen Reactions   Shellfish Allergy Hives   Pork-Derived Products Hives   Tramadol      nausea    Past Medical History:  Diagnosis Date   Hyperlipidemia      Past Surgical History:  Procedure Laterality Date   I & D EXTREMITY Left 08/20/2015   Procedure: IRRIGATION AND DEBRIDEMENT OF FIRST SECOND AND THIRD TOE FRACTURE;  Surgeon: Amada Backer, MD;  Location: [facility] OR;  Service: Orthopedics;  Laterality: Left;    Family History  Problem Relation Age of Onset   Healthy Mother    Healthy Father     Social History   Tobacco Use   Smoking status: Every Day    Current packs/day: 2.00    Types: Cigars, Cigarettes   Smokeless tobacco: Never  Vaping Use   Vaping status: Never Used  Substance Use Topics   Alcohol use: Yes    Comment: occ   Drug use: Yes    Types: Marijuana    ROS   Objective:    Vitals: BP 121/78 (BP Location: Right Arm)   Pulse 69   Temp 98.4 F (36.9 C) (Oral)   Resp 16   SpO2 93%   Physical Exam Constitutional:      General: He is not in acute distress.    Appearance: Normal appearance. He is well-developed and normal weight. He is not ill-appearing, toxic-appearing or diaphoretic.  HENT:     Head: Normocephalic and atraumatic.     Right Ear: External ear normal.     Left Ear: External ear normal.     Nose: Nose normal.     Mouth/Throat:     Pharynx: Oropharynx is clear.  Eyes:     General: No scleral icterus.       Right eye: No discharge.        Left eye: No discharge.     Extraocular Movements: Extraocular movements intact.  Cardiovascular:     Rate and Rhythm: Normal rate.  Pulmonary:     Effort: Pulmonary effort is normal.  Abdominal:     General: Bowel sounds are normal. There is no distension.     Palpations: Abdomen is soft. There is no mass.     Tenderness: There is no abdominal tenderness. There is no right CVA tenderness, left CVA tenderness, guarding or rebound.  Musculoskeletal:     Cervical back: Normal  range of motion.  Neurological:     Mental Status: He is alert and oriented to person, place, and time.  Psychiatric:        Mood and Affect: Mood normal.        Behavior: Behavior normal.        Thought Content: Thought content normal.        Judgment: Judgment normal.     Results for orders placed or performed during the hospital encounter of 09/13/23 (from the past 24 hours)  POCT urinalysis dipstick     Status: Abnormal   Collection Time: 09/13/23  6:26 PM  Result Value Ref Range   Color, UA yellow yellow   Clarity, UA cloudy (A) clear   Glucose, UA negative negative mg/dL   Bilirubin, UA negative negative   Ketones, POC UA negative negative mg/dL   Spec Grav, UA 1.610 9.604 - 1.025   Blood, UA large (A) negative   pH, UA 7.0 5.0 - 8.0   Protein Ur, POC negative negative mg/dL   Urobilinogen, UA 0.2 0.2 or 1.0  E.U./dL   Nitrite, UA Negative Negative   Leukocytes, UA Small (1+) (A) Negative    Assessment and Plan :   PDMP not reviewed this encounter.  1. Hematuria, unspecified type    X-ray over-read was pending at time of discharge, recommended follow up with only abnormal results. Otherwise will not call for negative over-read. Patient was in agreement. Discussed possibility of renal stone.  Will defer ER visit for now as patient is largely asymptomatic.  He is hemodynamically stable.  Urine culture pending.  Recommend Flomax, aggressive hydration.  Counseled patient on potential for adverse effects with medications prescribed/recommended today, ER and return-to-clinic precautions discussed, patient verbalized understanding.    Adolph Hoop, [place] 09/13/23 1910

## 2023-09-13 NOTE — ED Triage Notes (Addendum)
 Pt c/o blood in urine started yesterday-NAD-steady gait

## 2023-09-14 ENCOUNTER — Telehealth: Payer: Self-pay

## 2023-10-27 ENCOUNTER — Encounter (HOSPITAL_BASED_OUTPATIENT_CLINIC_OR_DEPARTMENT_OTHER): Payer: Self-pay | Admitting: *Deleted

## 2023-10-27 ENCOUNTER — Other Ambulatory Visit: Payer: Self-pay

## 2023-10-27 ENCOUNTER — Emergency Department (HOSPITAL_BASED_OUTPATIENT_CLINIC_OR_DEPARTMENT_OTHER)
Admission: EM | Admit: 2023-10-27 | Discharge: 2023-10-27 | Disposition: A | Discharge status: Home / Self Care | Attending: Emergency Medicine | Admitting: Emergency Medicine

## 2023-10-27 ENCOUNTER — Emergency Department (HOSPITAL_BASED_OUTPATIENT_CLINIC_OR_DEPARTMENT_OTHER)

## 2023-10-27 DIAGNOSIS — M7989 Other specified soft tissue disorders: Secondary | ICD-10-CM | POA: Diagnosis not present

## 2023-10-27 DIAGNOSIS — W228XXA Striking against or struck by other objects, initial encounter: Secondary | ICD-10-CM | POA: Insufficient documentation

## 2023-10-27 DIAGNOSIS — S63501A Unspecified sprain of right wrist, initial encounter: Secondary | ICD-10-CM

## 2023-10-27 DIAGNOSIS — S6990XA Unspecified injury of unspecified wrist, hand and finger(s), initial encounter: Secondary | ICD-10-CM

## 2023-10-27 DIAGNOSIS — Z7982 Long term (current) use of aspirin: Secondary | ICD-10-CM | POA: Insufficient documentation

## 2023-10-27 DIAGNOSIS — Y9389 Activity, other specified: Secondary | ICD-10-CM | POA: Insufficient documentation

## 2023-10-27 DIAGNOSIS — S6991XA Unspecified injury of right wrist, hand and finger(s), initial encounter: Secondary | ICD-10-CM | POA: Diagnosis present

## 2023-10-27 MED ORDER — HYDROCODONE-ACETAMINOPHEN 5-325 MG PO TABS
2.0000 | ORAL_TABLET | Freq: Once | ORAL | Status: AC
  Administered 2023-10-27: 2 via ORAL
  Filled 2023-10-27: qty 2

## 2023-10-27 MED ORDER — HYDROCODONE-ACETAMINOPHEN 5-325 MG PO TABS: 1.0000 | ORAL_TABLET | Freq: Four times a day (QID) | ORAL | 0 refills | Status: AC | PRN

## 2023-10-27 NOTE — Discharge Instructions (Signed)
 Wear wrist splint for comfort and support.  Ice for 20 minutes every 2 hours while awake for the next 2 days.  Take ibuprofen  600 mg every 6 hours as needed for pain.  Begin taking hydrocodone as prescribed as needed for pain not relieved with ibuprofen .  Follow-up with your primary doctor if the symptoms do not improve in the next week.

## 2023-10-27 NOTE — ED Provider Notes (Signed)
 7:03 AM Care assumed from Dr. Abe Abed.  At time of transfer of care, patient waiting for results of x-rays of the hand and wrist to look for fractures.  Patient placed into a Velcro removable splint as previous team suspected will be a sprain however if fracture is discovered, will give hand follow-up.  Anticipate discharge after imaging is completed.  7:09 AM Patient tells me he does not want to wait for the results.  He would like to go home and family will take him now.  Patient was given instructions for hand surgery follow-up in case he looks on MyChart and it is broken or if he symptoms persist for reevaluation.  Patient and family agree with plan of care and will keep it immobilized.  He understood return precautions and follow-up instructions and patient was discharged in good condition.   Clinical Impression: 1. Sprain of right wrist, initial encounter   2. Injury of hand, unspecified laterality, initial encounter   3. Injury of wrist, unspecified laterality, initial encounter     Disposition: Discharge  Condition: Good  I have discussed the results, Dx and Tx plan with the pt(& family if present). He/she/they expressed understanding and agree(s) with the plan. Discharge instructions discussed at great length. Strict return precautions discussed and pt &/or family have verbalized understanding of the instructions. No further questions at time of discharge.    Discharge Medication List as of 10/27/2023  7:07 AM     START taking these medications   Details  HYDROcodone-acetaminophen  (NORCO/VICODIN) 5-325 MG tablet Take 1-2 tablets by mouth every 6 (six) hours as needed., Starting Sun 10/27/2023, Normal        Follow Up: [place], [facility] [address] [place] [address] [phone]   with hand surgery for follow up of symptoms persist.     Zachry Hopfensperger, Marine Sia, MD 10/27/23 [phone]

## 2023-10-27 NOTE — ED Triage Notes (Signed)
 Pt BIB by girlfriend . Pt c/o rt wrist pain . Pain now radiating to fingers. Onset 2am  After playing a punching game and hit object the wrong way. Limited movement. Unable to make a fist.

## 2023-10-27 NOTE — ED Provider Notes (Signed)
  [facility] Provider Note   CSN: [id number] Arrival date & time: 10/27/23  0541     History  Chief Complaint  Patient presents with   Wrist Injury    Right     Edward Schroeder is a 43 y.o. male.  Patient is a 43 year old male presenting with a right wrist/hand injury.  He reports punching a mechanical punching bag while playing a game.  He reports hitting it several times, but on the third time injured the area.  He has pain with range of motion of the wrist and hand.       Home Medications Prior to Admission medications   Medication Sig Start Date End Date Taking? Authorizing Provider  aspirin  81 MG chewable tablet Chew 81 mg by mouth once. 02/03/22   [provider]  cephALEXin  (KEFLEX ) 500 MG capsule Take 1 capsule (500 mg total) by mouth 3 (three) times daily. 12/16/22   Tonya Fredrickson, MD  cyclobenzaprine  (FLEXERIL ) 10 MG tablet Take 1 tablet (10 mg total) by mouth 2 (two) times daily as needed for muscle spasms. 07/13/23   Scarlette Currier, MD  naproxen  (NAPROSYN ) 500 MG tablet Take 1 tablet (500 mg total) by mouth 2 (two) times daily. 09/15/22   Orvilla Blander, MD  tamsulosin  (FLOMAX ) 0.4 MG CAPS capsule Take 1 capsule (0.4 mg total) by mouth daily after supper. 09/13/23   Adolph Hoop, PA-C      Allergies    Shellfish allergy, Pork-derived products, and Tramadol     Review of Systems   Review of Systems  All other systems reviewed and are negative.   Physical Exam Updated Vital Signs BP 123/85 (BP Location: Left Arm)   Pulse 80   Temp 99.1 F (37.3 C) (Oral)   Resp 18   Ht 5\' 9"  (1.753 m)   Wt 88.5 kg   SpO2 96%   BMI 28.80 kg/m  Physical Exam Vitals and nursing note reviewed.  Constitutional:      Appearance: Normal appearance.  Pulmonary:     Effort: Pulmonary effort is normal.  Musculoskeletal:     Comments: The right hand and wrist are grossly normal in appearance.  He has tenderness to palpation  over the dorsal aspect of the wrist and proximal metacarpals.  He is able to move all fingers and sensation is intact throughout all fingers.  Skin:    General: Skin is warm and dry.  Neurological:     Mental Status: He is alert and oriented to person, place, and time.     ED Results / Procedures / Treatments   Labs (all labs ordered are listed, but only abnormal results are displayed) Labs Reviewed - No data to display  EKG None  Radiology No results found.  Procedures Procedures    Medications Ordered in ED Medications - No data to display  ED Course/ Medical Decision Making/ A&P  X-rays negative for fracture.  This to be treated as a sprain with a Velcro splint, ice, rest, and follow-up as needed if not improving.  Final Clinical Impression(s) / ED Diagnoses Final diagnoses:  None    Rx / DC Orders ED Discharge Orders     None         Orvilla Blander, MD 10/27/23 [phone]

## 2023-11-20 ENCOUNTER — Ambulatory Visit
Admission: EM | Admit: 2023-11-20 | Discharge: 2023-11-20 | Disposition: A | Discharge status: Home / Self Care | Source: Ambulatory Visit | Attending: Physician Assistant | Admitting: Physician Assistant

## 2023-11-20 VITALS — BP 112/78 | HR 56 | Temp 97.9°F | Resp 16

## 2023-11-20 DIAGNOSIS — M546 Pain in thoracic spine: Secondary | ICD-10-CM | POA: Diagnosis not present

## 2023-11-20 MED ORDER — KETOROLAC TROMETHAMINE 30 MG/ML IJ SOLN
30.0000 mg | Freq: Once | INTRAMUSCULAR | Status: AC
  Administered 2023-11-20: 30 mg via INTRAMUSCULAR

## 2023-11-20 NOTE — ED Triage Notes (Signed)
 Pt states mid back pain since last night, states it is worse when he moves his arms or turns a certain way. Pt denies any injury.

## 2023-11-20 NOTE — Discharge Instructions (Signed)
 Based on your symptoms and physical exam I believe the following is the cause of your concern today Back pain likely secondary to a strain of your back muscles  I recommend the following at this time to help relieve that discomfort:  Rest Warm compresses to the area (20 minutes on, minimum of 30 minutes off) You can alternate Tylenol  and Ibuprofen  for pain management but Ibuprofen  is typically preferred to reduce inflammation.   Gentle stretches and exercises that I have included in your paperwork Try to reduce excess strain to the area and rest as much as possible  Wear supportive shoes and, if you must lift anything, use proper lifting techniques that spare your back.   We have provided you with a Toradol  30 mg injection to help with your pain. You can continue to take Tylenol  and Ibuprofen  for pain management but I'd recommend using Tylenol  for the next 2 days so you do not tax your kidneys.  Make sure you are staying well hydrated when using NSAIDs   If your symptoms are not improving or seem to be getting worse, I recommend follow up with your PCP or Orthopedics for further evaluation and ongoing management.   [facility] [address], [place], [place] [address] [phone]  [facility] [address] [place], [place] [address]  [phone]

## 2023-11-20 NOTE — ED Provider Notes (Signed)
 [facility]    CSN: [id number] Arrival date & time: 11/20/23  1520      History   Chief Complaint Chief Complaint  Patient presents with   Back Pain    Entered by patient    HPI Edward Schroeder is a 43 y.o. male.   HPI  Pt presents today with back pain that has been ongoing since last night He reports it is sharp and constant but gets worse with movement  He tried taking Pepcid  last night in case this was related to reflux or gas but that did not help He reports the pain is largely under the left shoulder blade He denies injuries to the area or trauma  Interventions: Pepcid  and TUMS last night    He also reports concerns for continued right wrist and hand pain  He is wearing a wrist brace today but states he had some numbness with trying to click a lighter last week    Past Medical History:  Diagnosis Date   Hyperlipidemia     Patient Active Problem List   Diagnosis Date Noted   Sinus bradycardia 09/17/2022   Leukocytosis 09/17/2022   Tobacco abuse 09/17/2022   Chest pain 03/18/2022   Elevated troponin    Precordial pain    Coronary artery disease involving native coronary artery of native heart    Influenza    Fever in adult 06/03/2016   Headache 06/03/2016   Acute back pain     Past Surgical History:  Procedure Laterality Date   I & D EXTREMITY Left 08/20/2015   Procedure: IRRIGATION AND DEBRIDEMENT OF FIRST SECOND AND THIRD TOE FRACTURE;  Surgeon: Norleen Armor, MD;  Location: [facility] OR;  Service: Orthopedics;  Laterality: Left;       Home Medications    Prior to Admission medications   Medication Sig Start Date End Date Taking? Authorizing Provider  aspirin  81 MG chewable tablet Chew 81 mg by mouth once. 02/03/22   [provider]  cephALEXin  (KEFLEX ) 500 MG capsule Take 1 capsule (500 mg total) by mouth 3 (three) times daily. 12/16/22   Towana Ozell BROCKS, MD  cyclobenzaprine  (FLEXERIL ) 10 MG tablet Take 1 tablet (10 mg total) by mouth  2 (two) times daily as needed for muscle spasms. 07/13/23   Dreama Longs, MD  HYDROcodone -acetaminophen  (NORCO/VICODIN) 5-325 MG tablet Take 1-2 tablets by mouth every 6 (six) hours as needed. 10/27/23   Geroldine Berg, MD  naproxen  (NAPROSYN ) 500 MG tablet Take 1 tablet (500 mg total) by mouth 2 (two) times daily. 09/15/22   Geroldine Berg, MD  tamsulosin  (FLOMAX ) 0.4 MG CAPS capsule Take 1 capsule (0.4 mg total) by mouth daily after supper. 09/13/23   Christopher Savannah, PA-C    Family History Family History  Problem Relation Age of Onset   Healthy Mother    Healthy Father     Social History Social History   Tobacco Use   Smoking status: Every Day    Current packs/day: 2.00    Types: Cigars, Cigarettes   Smokeless tobacco: Never  Vaping Use   Vaping status: Never Used  Substance Use Topics   Alcohol use: Yes    Comment: occ   Drug use: Yes    Types: Marijuana     Allergies   Shellfish allergy, Pork-derived products, and Tramadol    Review of Systems Review of Systems  Constitutional:  Negative for chills and fever.  Respiratory:  Negative for chest tightness, shortness of breath and wheezing.  Genitourinary:  Negative for difficulty urinating and dysuria.  Musculoskeletal:  Positive for back pain.     Physical Exam Triage Vital Signs ED Triage Vitals  Encounter Vitals Group     BP 11/20/23 1537 112/78     Girls Systolic BP Percentile --      Girls Diastolic BP Percentile --      Boys Systolic BP Percentile --      Boys Diastolic BP Percentile --      Pulse Rate 11/20/23 1537 (!) 56     Resp 11/20/23 1537 16     Temp 11/20/23 1537 97.9 F (36.6 C)     Temp Source 11/20/23 1537 Oral     SpO2 11/20/23 1537 95 %     Weight --      Height --      Head Circumference --      Peak Flow --      Pain Score 11/20/23 1538 10     Pain Loc --      Pain Education --      Exclude from Growth Chart --    No data found.  Updated Vital Signs BP 112/78 (BP Location: Right  Arm)   Pulse (!) 56   Temp 97.9 F (36.6 C) (Oral)   Resp 16   SpO2 95%   Visual Acuity Right Eye Distance:   Left Eye Distance:   Bilateral Distance:    Right Eye Near:   Left Eye Near:    Bilateral Near:     Physical Exam Vitals reviewed.  Constitutional:      General: He is awake.     Appearance: Normal appearance. He is well-developed and well-groomed.  HENT:     Head: Normocephalic and atraumatic.  Eyes:     Extraocular Movements: Extraocular movements intact.     Conjunctiva/sclera: Conjunctivae normal.  Pulmonary:     Effort: Pulmonary effort is normal.  Musculoskeletal:     Right shoulder: No swelling, deformity, effusion, tenderness or bony tenderness. Normal range of motion. Normal strength. Normal pulse.     Left shoulder: Normal. No swelling, deformity, effusion, laceration, tenderness or bony tenderness. Normal range of motion. Normal strength. Normal pulse.     Cervical back: Normal range of motion.     Thoracic back: No swelling, edema, lacerations, spasms, tenderness or bony tenderness. Normal range of motion.       Back:  Neurological:     Mental Status: He is alert and oriented to person, place, and time.  Psychiatric:        Attention and Perception: Attention normal.        Mood and Affect: Mood normal.        Speech: Speech normal.        Behavior: Behavior normal. Behavior is cooperative.      UC Treatments / Results  Labs (all labs ordered are listed, but only abnormal results are displayed) Labs Reviewed - No data to display  EKG   Radiology No results found.  Procedures Procedures (including critical care time)  Medications Ordered in UC Medications  ketorolac  (TORADOL ) 30 MG/ML injection 30 mg (30 mg Intramuscular Given 11/20/23 1615)    Initial Impression / Assessment and Plan / UC Course  I have reviewed the triage vital signs and the nursing notes.  Pertinent labs & imaging results that were available during my care of the  patient were reviewed by me and considered in my medical decision making (see chart for details).  Final Clinical Impressions(s) / UC Diagnoses   Final diagnoses:  Acute left-sided thoracic back pain   Patient presents today with concerns for acute, left-sided thoracic back pain that started last night.  He denies injuries or trauma to the area.  Physical exam including range of motion of the shoulders and thoracic spine are normal.  Vitals are overall reassuring.  Given presentation and HPI I suspect likely muscular strain and recommend conservative home measures for symptomatic relief. Will provide stretches in AVS to assist with recovery. Toradol  30 mg injection provided in clinic today to assist with pain. Recommend follow up here or with Ortho if symptoms are not improving in 2-4 weeks.     Discharge Instructions      Based on your symptoms and physical exam I believe the following is the cause of your concern today Back pain likely secondary to a strain of your back muscles  I recommend the following at this time to help relieve that discomfort:  Rest Warm compresses to the area (20 minutes on, minimum of 30 minutes off) You can alternate Tylenol  and Ibuprofen  for pain management but Ibuprofen  is typically preferred to reduce inflammation.   Gentle stretches and exercises that I have included in your paperwork Try to reduce excess strain to the area and rest as much as possible  Wear supportive shoes and, if you must lift anything, use proper lifting techniques that spare your back.   We have provided you with a Toradol  30 mg injection to help with your pain. You can continue to take Tylenol  and Ibuprofen  for pain management but I'd recommend using Tylenol  for the next 2 days so you do not tax your kidneys.  Make sure you are staying well hydrated when using NSAIDs   If your symptoms are not improving or seem to be getting worse, I recommend follow up with your PCP or  Orthopedics for further evaluation and ongoing management.   [facility] [address], [address], [place], [place] [address] [phone]  [facility] [address] [place], [place] [address]  [phone]        ED Prescriptions   None    PDMP not reviewed this encounter.   Marylene Rocky BRAVO, PA-C 11/20/23 8061

## 2023-12-20 ENCOUNTER — Other Ambulatory Visit: Payer: Self-pay

## 2023-12-20 ENCOUNTER — Emergency Department (HOSPITAL_COMMUNITY)
Admission: EM | Admit: 2023-12-20 | Discharge: 2023-12-20 | Disposition: A | Discharge status: Home / Self Care | Attending: Emergency Medicine | Admitting: Emergency Medicine

## 2023-12-20 ENCOUNTER — Emergency Department (HOSPITAL_COMMUNITY)

## 2023-12-20 DIAGNOSIS — Z7982 Long term (current) use of aspirin: Secondary | ICD-10-CM | POA: Diagnosis not present

## 2023-12-20 DIAGNOSIS — M545 Low back pain, unspecified: Secondary | ICD-10-CM | POA: Insufficient documentation

## 2023-12-20 MED ORDER — OXYCODONE HCL 5 MG PO TABS: 5.0000 mg | ORAL_TABLET | Freq: Four times a day (QID) | ORAL | 0 refills | Status: AC | PRN

## 2023-12-20 MED ORDER — LIDOCAINE 5 % EX PTCH: 1.0000 | MEDICATED_PATCH | CUTANEOUS | 0 refills | Status: AC

## 2023-12-20 MED ORDER — NAPROXEN 375 MG PO TABS: 375.0000 mg | ORAL_TABLET | Freq: Two times a day (BID) | ORAL | 0 refills | Status: DC

## 2023-12-20 MED ORDER — CYCLOBENZAPRINE HCL 7.5 MG PO TABS: 7.5000 mg | ORAL_TABLET | Freq: Two times a day (BID) | ORAL | 0 refills | Status: DC | PRN

## 2023-12-20 MED ORDER — ONDANSETRON HCL 4 MG/2ML IJ SOLN
4.0000 mg | Freq: Once | INTRAMUSCULAR | Status: AC
  Administered 2023-12-20: 4 mg via INTRAVENOUS
  Filled 2023-12-20: qty 2

## 2023-12-20 MED ORDER — LIDOCAINE 5 % EX PTCH
1.0000 | MEDICATED_PATCH | CUTANEOUS | Status: DC
  Administered 2023-12-20: 1 via TRANSDERMAL
  Filled 2023-12-20: qty 1

## 2023-12-20 MED ORDER — NAPROXEN 250 MG PO TABS
500.0000 mg | ORAL_TABLET | Freq: Once | ORAL | Status: AC
  Administered 2023-12-20: 500 mg via ORAL
  Filled 2023-12-20: qty 2

## 2023-12-20 MED ORDER — KETOROLAC TROMETHAMINE 30 MG/ML IJ SOLN
30.0000 mg | Freq: Once | INTRAMUSCULAR | Status: AC
  Administered 2023-12-20: 30 mg via INTRAVENOUS
  Filled 2023-12-20: qty 1

## 2023-12-20 MED ORDER — MORPHINE SULFATE (PF) 4 MG/ML IV SOLN
4.0000 mg | Freq: Once | INTRAVENOUS | Status: AC
  Administered 2023-12-20: 4 mg via INTRAVENOUS
  Filled 2023-12-20: qty 1

## 2023-12-20 MED ORDER — CYCLOBENZAPRINE HCL 10 MG PO TABS
10.0000 mg | ORAL_TABLET | Freq: Once | ORAL | Status: AC
  Administered 2023-12-20: 10 mg via ORAL
  Filled 2023-12-20: qty 1

## 2023-12-20 NOTE — Discharge Instructions (Signed)
 You have been seen and discharged from the emergency department.  CT imaging shows 2 small disc bulges in the last 2 vertebrae of your lumbar spine.  This is most likely contributing to your musculoskeletal strain/symptoms.  Continue taking Tylenol  and prescribed naproxen  for pain control.  Use Lidoderm  patches as directed.  Use Flexeril  as prescribed and you have also been sent with stronger pain medicine to use for breakthrough if needed.  Do not mix this medication with alcohol or other sedating medications. Do not drive or do heavy physical activity until you know how this medication affects you.  It may cause drowsiness.  Follow-up with your primary provider for further evaluation and further care. Take home medications as prescribed. If you have any worsening symptoms or further concerns for your health please return to an emergency department for further evaluation.

## 2023-12-20 NOTE — ED Triage Notes (Signed)
 Patient arrives via [facility] for back pain. Patient was working outside in his yard and felt a sharp pain in lumbar region that radiates from side to side. Patient given 100mcg of fentanyl  en route.   EMS vitals 130/70 HR 55 O2 99 on room air

## 2023-12-20 NOTE — ED Provider Notes (Signed)
 [place] EMERGENCY DEPARTMENT AT [facility] Provider Note   CSN: [id number] Arrival date & time: 12/20/23  1016     Patient presents with: Back Pain   Lonzie Simmer is a 43 y.o. male.   HPI   43 year old male presents emergency department with lower back pain.  Patient states he was outside, cutting down weeds when he had sharp lower back pain that radiated from side-to-side.  He states that he stopped cutting and went inside and sat down.  Certain movements makes the pain worse.  Now the pain seems more predominantly on the right side going into the buttocks.  Pain does not radiate down to the legs.  There is no numbness/weakness.  No pain in the abdomen or chest.  No history of AAA.  Prior to Admission medications   Medication Sig Start Date End Date Taking? Authorizing Provider  aspirin  81 MG chewable tablet Chew 81 mg by mouth once. 02/03/22   [provider]  cephALEXin  (KEFLEX ) 500 MG capsule Take 1 capsule (500 mg total) by mouth 3 (three) times daily. 12/16/22   Towana Ozell BROCKS, MD  cyclobenzaprine  (FLEXERIL ) 10 MG tablet Take 1 tablet (10 mg total) by mouth 2 (two) times daily as needed for muscle spasms. 07/13/23   Dreama Longs, MD  HYDROcodone -acetaminophen  (NORCO/VICODIN) 5-325 MG tablet Take 1-2 tablets by mouth every 6 (six) hours as needed. 10/27/23   Geroldine Berg, MD  naproxen  (NAPROSYN ) 500 MG tablet Take 1 tablet (500 mg total) by mouth 2 (two) times daily. 09/15/22   Geroldine Berg, MD  tamsulosin  (FLOMAX ) 0.4 MG CAPS capsule Take 1 capsule (0.4 mg total) by mouth daily after supper. 09/13/23   Christopher Savannah, PA-C    Allergies: Shellfish allergy, Pork-derived products, and Tramadol     Review of Systems  Constitutional:  Negative for fever.  Respiratory:  Negative for shortness of breath.   Cardiovascular:  Negative for chest pain.  Gastrointestinal:  Negative for abdominal pain, diarrhea and vomiting.  Musculoskeletal:  Positive for back pain.   Skin:  Negative for rash.  Neurological:  Negative for weakness, numbness and headaches.    Updated Vital Signs BP 128/74 (BP Location: Right Arm)   Pulse (!) 50   Temp 98.1 F (36.7 C) (Oral)   Resp 16   Ht 5' 9 (1.753 m)   Wt 88.5 kg   SpO2 95%   BMI 28.81 kg/m   Physical Exam Vitals and nursing note reviewed.  Constitutional:      Appearance: Normal appearance.  HENT:     Head: Normocephalic.     Mouth/Throat:     Mouth: Mucous membranes are moist.  Cardiovascular:     Rate and Rhythm: Normal rate.  Pulmonary:     Effort: Pulmonary effort is normal. No respiratory distress.  Abdominal:     Palpations: Abdomen is soft.     Tenderness: There is no abdominal tenderness.  Musculoskeletal:     Comments: Reproducible tenderness to palpation in the right lateral lumbar musculature, no midline spinal tenderness or step-offs, neurovascularly intact in the lower extremities  Skin:    General: Skin is warm.  Neurological:     Mental Status: He is alert and oriented to person, place, and time. Mental status is at baseline.  Psychiatric:        Mood and Affect: Mood normal.     (all labs ordered are listed, but only abnormal results are displayed) Labs Reviewed - No data to display  EKG:  None  Radiology: No results found.   Procedures   Medications Ordered in the ED  ketorolac  (TORADOL ) 30 MG/ML injection 30 mg (has no administration in time range)  lidocaine  (LIDODERM ) 5 % 1 patch (has no administration in time range)  cyclobenzaprine  (FLEXERIL ) tablet 10 mg (has no administration in time range)                                    Medical Decision Making Amount and/or Complexity of Data Reviewed Radiology: ordered.  Risk Prescription drug management.   43 year old male presents emergency department with onset of lower back pain while hiking weeds.  Pain initially was bilateral but is now localized in the right lower lumbar, somewhat reproducible on  exam.  No lower extremity symptoms.  No abdominal pain, no history of AAA.  Vitals are normal and stable.  Patient treated symptomatically initially but was unable to fully stand and weight-bear secondary to right lower back pain.  Does reveal history of kidney stones without any active genitourinary symptoms.  However given the degree of his muscular dysfunction we will pursue CT imaging.  CT renal shows nephrolithiasis but no other concerning findings in the abdomen, CT lumbar spine shows 2 areas of disc bulging, most likely contributing to his musculoskeletal strain/symptoms.  Will treat the patient symptomatically and refer outpatient.  Patient feels improved and agrees.  Patient at this time appears safe and stable for discharge and close outpatient follow up. Discharge plan and strict return to ED precautions discussed, patient verbalizes understanding and agreement.     Final diagnoses:  None    ED Discharge Orders     None          Bari Roxie HERO, DO 12/20/23 1547

## 2023-12-20 NOTE — ED Notes (Signed)
 Patient transported to CT

## 2023-12-20 NOTE — ED Notes (Signed)
 Assessed PT's pain level again. PT was comfortable while in bed but once he tried to stand, it was very painful.PT was unable to stand at all, he was hunched over.

## 2023-12-20 NOTE — ED Notes (Signed)
 Pt unable to stand

## 2023-12-20 NOTE — ED Notes (Signed)
 Pt able to stand with staff assist. DC paperwork given.

## 2024-02-22 ENCOUNTER — Encounter (HOSPITAL_BASED_OUTPATIENT_CLINIC_OR_DEPARTMENT_OTHER): Payer: Self-pay | Admitting: Emergency Medicine

## 2024-02-22 ENCOUNTER — Emergency Department (HOSPITAL_BASED_OUTPATIENT_CLINIC_OR_DEPARTMENT_OTHER)
Admission: EM | Admit: 2024-02-22 | Discharge: 2024-02-22 | Disposition: A | Discharge status: Home / Self Care | Attending: Emergency Medicine | Admitting: Emergency Medicine

## 2024-02-22 DIAGNOSIS — M5442 Lumbago with sciatica, left side: Secondary | ICD-10-CM | POA: Insufficient documentation

## 2024-02-22 DIAGNOSIS — Z7982 Long term (current) use of aspirin: Secondary | ICD-10-CM | POA: Diagnosis not present

## 2024-02-22 DIAGNOSIS — I2510 Atherosclerotic heart disease of native coronary artery without angina pectoris: Secondary | ICD-10-CM | POA: Diagnosis not present

## 2024-02-22 DIAGNOSIS — M545 Low back pain, unspecified: Secondary | ICD-10-CM | POA: Diagnosis present

## 2024-02-22 DIAGNOSIS — Z720 Tobacco use: Secondary | ICD-10-CM | POA: Diagnosis not present

## 2024-02-22 MED ORDER — METHYLPREDNISOLONE 4 MG PO TBPK: ORAL_TABLET | ORAL | 0 refills | Status: DC

## 2024-02-22 MED ORDER — KETOROLAC TROMETHAMINE 30 MG/ML IJ SOLN
30.0000 mg | Freq: Once | INTRAMUSCULAR | Status: AC
Start: 2024-02-22 — End: 2024-02-22
  Administered 2024-02-22: 30 mg via INTRAMUSCULAR
  Filled 2024-02-22: qty 1

## 2024-02-22 NOTE — Discharge Instructions (Addendum)
 Return to the emergency department if your symptoms worsen.  Start Medrol  Dosepak, use as directed on packaging.  Continue ibuprofen  as needed for pain.  I have provided you with the contact information for an orthopedic specialist, please schedule follow-up in regard to your symptoms if they persist.  Follow-up with your PCP as needed.

## 2024-02-22 NOTE — ED Provider Notes (Signed)
 [place] EMERGENCY DEPARTMENT AT [facility] Provider Note   CSN: [id number] Arrival date & time: 02/22/24  8060     Patient presents with: Back Pain   Edward Schroeder is a 43 y.o. male.  {Add pertinent medical, surgical, social history, OB history to HPI:6880} 43 year old male presenting with back pain.  Patient reports that he woke up this morning with some left-sided low back pain, over the course of the day his pain progressed and is extending around the front of his leg, this is worse with movement and seems to radiate down his leg at times.  He has tried ibuprofen  and Tylenol  at home with minimal relief of his symptoms.  Patient has been told in the past that he has bulging disks in his back, he is not currently followed by an orthopedist but plans to schedule an appointment with [facility] for further workup of his symptoms.  He denies bowel/bladder incontinence, saddle anesthesia, fever.   Back Pain      Prior to Admission medications   Medication Sig Start Date End Date Taking? Authorizing Provider  methylPREDNISolone  (MEDROL  DOSEPAK) 4 MG TBPK tablet Use as directed on packaging 02/22/24  Yes Lemoyne Nestor N, PA-C  aspirin  81 MG chewable tablet Chew 81 mg by mouth once. 02/03/22   [provider]  cephALEXin  (KEFLEX ) 500 MG capsule Take 1 capsule (500 mg total) by mouth 3 (three) times daily. 12/16/22   Towana Ozell BROCKS, MD  cyclobenzaprine  (FEXMID ) 7.5 MG tablet Take 1 tablet (7.5 mg total) by mouth 2 (two) times daily as needed for muscle spasms. 12/20/23   Horton, Kristie M, DO  HYDROcodone -acetaminophen  (NORCO/VICODIN) 5-325 MG tablet Take 1-2 tablets by mouth every 6 (six) hours as needed. 10/27/23   Geroldine Berg, MD  lidocaine  (LIDODERM ) 5 % Place 1 patch onto the skin daily. Remove & Discard patch within 12 hours or as directed by MD 12/20/23   Horton, Roxie HERO, DO  naproxen  (NAPROSYN ) 375 MG tablet Take 1 tablet (375 mg total) by mouth 2 (two) times  daily. 12/20/23   Horton, Roxie HERO, DO  oxyCODONE  (ROXICODONE ) 5 MG immediate release tablet Take 1 tablet (5 mg total) by mouth every 6 (six) hours as needed for severe pain (pain score 7-10). 12/20/23   Horton, Roxie HERO, DO  tamsulosin  (FLOMAX ) 0.4 MG CAPS capsule Take 1 capsule (0.4 mg total) by mouth daily after supper. 09/13/23   Christopher Savannah, PA-C    Allergies: Shellfish allergy, Pork-derived products, and Tramadol     Review of Systems  Musculoskeletal:  Positive for back pain.    Updated Vital Signs BP 129/86 (BP Location: Right Arm)   Pulse 81   Temp 98 F (36.7 C) (Oral)   Resp 20   SpO2 97%   Physical Exam Vitals and nursing note reviewed.  HENT:     Head: Normocephalic.  Eyes:     Extraocular Movements: Extraocular movements intact.  Cardiovascular:     Rate and Rhythm: Normal rate.  Pulmonary:     Effort: Pulmonary effort is normal.  Musculoskeletal:     Cervical back: Normal range of motion.     Comments: Back: No midline spinous process tenderness to palpation.  Left sided paralumbar muscle tenderness to palpation with tenderness to palpation of anterior thigh.  5/5 strength against resistance of bilateral lower extremities.   Skin:    General: Skin is warm and dry.  Neurological:     Mental Status: He is alert and oriented to person,  place, and time.     Sensory: No sensory deficit.     Motor: No weakness.     (all labs ordered are listed, but only abnormal results are displayed) Labs Reviewed - No data to display  EKG: None  Radiology: No results found.  {Document cardiac monitor, telemetry assessment procedure when appropriate:32947} Procedures   Medications Ordered in the ED  ketorolac  (TORADOL ) 30 MG/ML injection 30 mg (has no administration in time range)      {Click here for ABCD2, HEART and other calculators REFRESH Note before signing:1}                              Medical Decision Making This patient presents to the ED for concern of  ***, this involves an extensive number of treatment options, and is a complaint that carries with it a high risk of complications and morbidity.  The differential diagnosis includes ***   Co morbidities that complicate the patient evaluation  ***   Additional history obtained:  Additional history obtained from *** External records from outside source obtained and reviewed including ***   Lab Tests:  I Ordered, and personally interpreted labs.  The pertinent results include:  ***    Imaging Studies ordered:  I ordered imaging studies including ***  I independently visualized and interpreted imaging which showed *** I agree with the radiologist interpretation   Cardiac Monitoring: / EKG:  The patient was maintained on a cardiac monitor.  I personally viewed and interpreted the cardiac monitored which showed an underlying rhythm of: ***   Consultations Obtained:  I requested consultation with the ***,  and discussed lab and imaging findings as well as pertinent plan - they recommend: ***   Problem List / ED Course / Critical interventions / Medication management  *** I ordered medication including ***  for ***  Reevaluation of the patient after these medicines showed that the patient {resolved/improved/worsened:23923::improved} I have reviewed the patients home medicines and have made adjustments as needed   Social Determinants of Health:  ***   Test / Admission - Considered:  Physical exam is notable as above.  Patient's symptoms are most consistent with sciatica, he does not endorse any red flag back pain signs/symptoms, I do not feel that labs/imaging are warranted at this time.  Patient has had similar symptoms in the past, has tried lidocaine  patches and Flexeril  previously but reports that these do not typically help him.  Toradol  injection given in the emergency department prior to discharge.  Will prescribe Medrol  Dosepak to be used as directed on packaging.   I will provide the patient with the contact information for an orthopedic specialist to schedule follow-up in the event that his symptoms persist.  Return precautions discussed, patient voiced understanding and is in agreement this plan.    Risk Prescription drug management.   ***  {Document critical care time when appropriate  Document review of labs and clinical decision tools ie CHADS2VASC2, etc  Document your independent review of radiology images and any outside records  Document your discussion with family members, caretakers and with consultants  Document social determinants of health affecting pt's care  Document your decision making why or why not admission, treatments were needed:32947:::1}   Final diagnoses:  Left-sided low back pain with left-sided sciatica, unspecified chronicity    ED Discharge Orders          Ordered    methylPREDNISolone  (MEDROL   DOSEPAK) 4 MG TBPK tablet        02/22/24 2128

## 2024-02-22 NOTE — ED Triage Notes (Signed)
 Lower back pain  2 months ago Now into left hip and shoots down left leg Sharp pain Worse throughout the day Movement makes it worse

## 2024-06-01 ENCOUNTER — Emergency Department (HOSPITAL_BASED_OUTPATIENT_CLINIC_OR_DEPARTMENT_OTHER): Admission: EM | Admit: 2024-06-01 | Discharge: 2024-06-01 | Disposition: A | Discharge status: Home / Self Care

## 2024-06-01 ENCOUNTER — Other Ambulatory Visit: Payer: Self-pay

## 2024-06-01 ENCOUNTER — Encounter (HOSPITAL_BASED_OUTPATIENT_CLINIC_OR_DEPARTMENT_OTHER): Payer: Self-pay

## 2024-06-01 DIAGNOSIS — M5442 Lumbago with sciatica, left side: Secondary | ICD-10-CM | POA: Diagnosis not present

## 2024-06-01 DIAGNOSIS — Z7982 Long term (current) use of aspirin: Secondary | ICD-10-CM | POA: Insufficient documentation

## 2024-06-01 DIAGNOSIS — M545 Low back pain, unspecified: Secondary | ICD-10-CM | POA: Diagnosis present

## 2024-06-01 MED ORDER — METHOCARBAMOL 750 MG PO TABS: 750.0000 mg | ORAL_TABLET | Freq: Three times a day (TID) | ORAL | 0 refills | Status: AC | PRN

## 2024-06-01 MED ORDER — MELOXICAM 7.5 MG PO TABS: 7.5000 mg | ORAL_TABLET | Freq: Every day | ORAL | 0 refills | Status: AC

## 2024-06-01 MED ORDER — KETOROLAC TROMETHAMINE 15 MG/ML IJ SOLN
15.0000 mg | Freq: Once | INTRAMUSCULAR | Status: AC
  Administered 2024-06-01: 15 mg via INTRAMUSCULAR
  Filled 2024-06-01: qty 1

## 2024-06-01 MED ORDER — METHYLPREDNISOLONE 4 MG PO TBPK: ORAL_TABLET | ORAL | 0 refills | Status: AC

## 2024-06-01 NOTE — ED Provider Notes (Signed)
 " [place] EMERGENCY DEPARTMENT AT [facility] Provider Note   CSN: [id number] Arrival date & time: 06/01/24  9179     Patient presents with: Back Pain   Edward Schroeder is a 44 y.o. male.   44 year old male with past medical history of sciatica presenting to the emergency department today with low back pain on the left side.  Patient states has been going on since Saturday.  He states he has had recurring issues with this.  Reports that he does have known bulging disc.  He denies any history of IV drug abuse.  Denies any fevers.  Denies any bowel or bladder dysfunction and has not had any saddle anesthesia.  He came to the emergency department today for further evaluation regarding this due to ongoing symptoms.  He denies any abdominal pain or vomiting.  Reports normal bowel movements.   Back Pain      Prior to Admission medications  Medication Sig Start Date End Date Taking? Authorizing Provider  meloxicam  (MOBIC ) 7.5 MG tablet Take 1 tablet (7.5 mg total) by mouth daily. 06/01/24  Yes Ula Prentice SAUNDERS, MD  methocarbamol  (ROBAXIN ) 750 MG tablet Take 1 tablet (750 mg total) by mouth every 8 (eight) hours as needed for muscle spasms. 06/01/24  Yes Ula Prentice SAUNDERS, MD  WELLBUTRIN XL 150 MG 24 hr tablet Take 150 mg by mouth 2 (two) times daily. start on day 1-3 1 tablet once daily then increase to twice daily. 05/28/24  Yes [provider]  aspirin  81 MG chewable tablet Chew 81 mg by mouth once. 02/03/22   [provider]  cephALEXin  (KEFLEX ) 500 MG capsule Take 1 capsule (500 mg total) by mouth 3 (three) times daily. 12/16/22   Towana Ozell BROCKS, MD  HYDROcodone -acetaminophen  (NORCO/VICODIN) 5-325 MG tablet Take 1-2 tablets by mouth every 6 (six) hours as needed. 10/27/23   Geroldine Berg, MD  lidocaine  (LIDODERM ) 5 % Place 1 patch onto the skin daily. Remove & Discard patch within 12 hours or as directed by MD 12/20/23   Horton, Roxie HERO, DO  methylPREDNISolone  (MEDROL   DOSEPAK) 4 MG TBPK tablet Use as directed on packaging 06/01/24   Ula Prentice SAUNDERS, MD  oxyCODONE  (ROXICODONE ) 5 MG immediate release tablet Take 1 tablet (5 mg total) by mouth every 6 (six) hours as needed for severe pain (pain score 7-10). 12/20/23   Horton, Roxie M, DO  tamsulosin  (FLOMAX ) 0.4 MG CAPS capsule Take 1 capsule (0.4 mg total) by mouth daily after supper. 09/13/23   Christopher Savannah, PA-C    Allergies: Shellfish allergy, Porcine (pork) protein-containing drug products, and Tramadol     Review of Systems  Musculoskeletal:  Positive for back pain.  All other systems reviewed and are negative.   Updated Vital Signs BP 122/72 (BP Location: Right Arm)   Pulse 63   Temp 97.6 F (36.4 C) (Oral)   Resp 16   Ht 5' 10 (1.778 m)   Wt 89.8 kg   SpO2 95%   BMI 28.41 kg/m   Physical Exam Vitals and nursing note reviewed.   Gen: NAD Eyes: PERRL, EOMI HEENT: no oropharyngeal swelling Neck: trachea midline Resp: clear to auscultation bilaterally Card: RRR, no murmurs, rubs, or gallops Abd: nontender, nondistended Extremities: no calf tenderness, no edema Vascular: 2+ radial pulses bilaterally, 2+ DP pulses bilaterally MSK: Positive straight leg raise on the left, the patient is tender over the left paraspinal region with no midline tenderness noted Neuro: Equal strength sensation throughout the  bilateral lower extremities with normal patellar Achilles reflex bilaterally Skin: no rashes Psyc: acting appropriately   (all labs ordered are listed, but only abnormal results are displayed) Labs Reviewed - No data to display  EKG: None  Radiology: No results found.   Procedures   Medications Ordered in the ED  ketorolac  (TORADOL ) 15 MG/ML injection 15 mg (has no administration in time range)                                    Medical Decision Making 43 year old male with past medical history of recurrent back issues presenting to the emergency department today with low back  pain consistent with sciatica.  The patient's neurovascular exam here is reassuring.  He does not have any red flag symptoms for cauda equina syndrome or cord compressing lesion at this time.  He states that steroids of helped in the past.  Will restart him on this.  He is already seeing his primary care doctor and completed physical therapy and is having recurring issues.  Will refer him to neurosurgery for further evaluation as an outpatient.  He will be discharged with return precautions.        Final diagnoses:  Acute left-sided low back pain with left-sided sciatica    ED Discharge Orders          Ordered    methylPREDNISolone  (MEDROL  DOSEPAK) 4 MG TBPK tablet        06/01/24 0855    meloxicam  (MOBIC ) 7.5 MG tablet  Daily        06/01/24 0855    methocarbamol  (ROBAXIN ) 750 MG tablet  Every 8 hours PRN        06/01/24 0855               Ula Prentice SAUNDERS, MD 06/01/24 [phone]  "

## 2024-06-01 NOTE — ED Triage Notes (Signed)
 Pt with lower back pain and spasms that started late Saturday night. More on the left. Took 3 Alleves, wasn't able to really move yesterday morning - stayed in bed until 1400. Ibuprofen  with no relief. Pt drove self. Denies pain radiation to legs, denies numbness/tingling to legs.

## 2024-06-01 NOTE — Discharge Instructions (Signed)
 Please take the Medrol  Dosepak as prescribed.  After you finish this you may start the meloxicam  daily as needed for pain until you follow-up with the neurosurgeon.  Take the methocarbamol  as needed for muscle spasms.  Do not drive or drink alcohol while taking this as it may make you drowsy.  Please call and schedule a follow-up appointment with the neurosurgeon at the number provided.  Return to the ER for loss of control of bowels or bladder or weakness as we discussed.
